# Patient Record
Sex: Female | Born: 1965 | Race: White | Hispanic: No | Marital: Married | Smoking: Never smoker
Health system: Southern US, Community
[De-identification: ages and names within clinical notes are randomized; demographics above are authoritative.]

## PROBLEM LIST (undated history)

## (undated) DIAGNOSIS — I82409 Acute embolism and thrombosis of unspecified deep veins of unspecified lower extremity: Secondary | ICD-10-CM

## (undated) DIAGNOSIS — D649 Anemia, unspecified: Secondary | ICD-10-CM

## (undated) DIAGNOSIS — F32A Depression, unspecified: Secondary | ICD-10-CM

## (undated) DIAGNOSIS — F419 Anxiety disorder, unspecified: Secondary | ICD-10-CM

## (undated) DIAGNOSIS — F329 Major depressive disorder, single episode, unspecified: Secondary | ICD-10-CM

## (undated) HISTORY — PX: ABDOMINAL HYSTERECTOMY: SHX81

---

## 2015-08-24 DIAGNOSIS — F419 Anxiety disorder, unspecified: Secondary | ICD-10-CM | POA: Diagnosis not present

## 2015-08-24 DIAGNOSIS — M545 Low back pain: Secondary | ICD-10-CM | POA: Diagnosis not present

## 2015-08-24 DIAGNOSIS — G4700 Insomnia, unspecified: Secondary | ICD-10-CM | POA: Diagnosis not present

## 2015-08-24 DIAGNOSIS — M25562 Pain in left knee: Secondary | ICD-10-CM | POA: Diagnosis not present

## 2015-10-22 DIAGNOSIS — S8982XD Other specified injuries of left lower leg, subsequent encounter: Secondary | ICD-10-CM | POA: Diagnosis not present

## 2015-11-01 DIAGNOSIS — M2242 Chondromalacia patellae, left knee: Secondary | ICD-10-CM | POA: Diagnosis not present

## 2015-11-01 DIAGNOSIS — S83242A Other tear of medial meniscus, current injury, left knee, initial encounter: Secondary | ICD-10-CM | POA: Diagnosis not present

## 2015-11-30 NOTE — H&P (Signed)
  Beverly Ingram is an 50 y.o. female.    Chief Complaint: left knee pain  HPI: Pt is a 50 y.o. female complaining of left knee pain for several months. Pain had continually increased since the beginning. X-rays in the clinic show meniscal tear left knee. Pt has tried various conservative treatments which have failed to alleviate their symptoms, including injections and therapy. Various options are discussed with the patient. Risks, benefits and expectations were discussed with the patient. Patient understand the risks, benefits and expectations and wishes to proceed with surgery.   PCP:  No primary care provider on file.  D/C Plans: Home  PMH: No past medical history on file.  PSH: No past surgical history on file.  Social History:  has no tobacco, alcohol, and drug history on file.  Allergies:  Allergies not on file  Medications: No current facility-administered medications for this encounter.    No current outpatient prescriptions on file.    No results found for this or any previous visit (from the past 48 hour(s)). No results found.  ROS: Pain with rom of the left lower extremity  Physical Exam:  Alert and oriented 50 y.o. female in no acute distress Cranial nerves 2-12 intact Cervical spine: full rom with no tenderness, nv intact distally Chest: active breath sounds bilaterally, no wheeze rhonchi or rales Heart: regular rate and rhythm, no murmur Abd: non tender non distended with active bowel sounds Hip is stable with rom  Left knee moderate medial joint line tenderness nv intact distally No rashes or edema Minimally antalgic gait  Assessment/Plan Assessment: left knee meniscal tear  Plan: Patient will undergo a left knee arthroscopy by Dr. Ranell PatrickNorris at [facility]. Risks benefits and expectations were discussed with the patient. Patient understand risks, benefits and expectations and wishes to proceed.

## 2015-12-14 NOTE — Pre-Procedure Instructions (Signed)
    Beverly Ingram  12/14/2015      [id number] - [place], [place] - [address] [place] [id number] Phone: [phone] Fax: [phone]    Your procedure is scheduled on 12/21/15.  [facility] Admitting at 12 noon  Call this number if you have problems the morning of surgery:  [phone]   Remember:  Do not eat food or drink liquids after midnight.  Take these medicines the morning of surgery with A SIP OF WATER estrace,zoloft,ultram   Do not wear jewelry, make-up or nail polish.  Do not wear lotions, powders, or perfumes, or deoderant.  Do not shave 48 hours prior to surgery.  Men may shave face and neck.  Do not bring valuables to the hospital.  [facility] is not responsible for any belongings or valuables.  Contacts, dentures or bridgework may not be worn into surgery.  Leave your suitcase in the car.  After surgery it may be brought to your room.  For patients admitted to the hospital, discharge time will be determined by your treatment team.  Patients discharged the day of surgery will not be allowed to drive home.   Name and phone number of your driver:   Special instructions: Do not take any aspirin,anti-inflammatories,vitamins,or herbal supplements 5-7 days prior to surgery.  Please read over the following fact sheets that you were given. MRSA Information

## 2015-12-15 ENCOUNTER — Encounter (HOSPITAL_COMMUNITY): Payer: Self-pay

## 2015-12-15 ENCOUNTER — Encounter (HOSPITAL_COMMUNITY)
Admission: RE | Admit: 2015-12-15 | Discharge: 2015-12-15 | Disposition: A | Discharge status: Home / Self Care | Payer: BLUE CROSS/BLUE SHIELD | Source: Ambulatory Visit | Attending: Orthopedic Surgery | Admitting: Orthopedic Surgery

## 2015-12-15 DIAGNOSIS — X58XXXA Exposure to other specified factors, initial encounter: Secondary | ICD-10-CM | POA: Diagnosis not present

## 2015-12-15 DIAGNOSIS — S83207A Unspecified tear of unspecified meniscus, current injury, left knee, initial encounter: Secondary | ICD-10-CM | POA: Diagnosis not present

## 2015-12-15 DIAGNOSIS — Z01812 Encounter for preprocedural laboratory examination: Secondary | ICD-10-CM | POA: Insufficient documentation

## 2015-12-15 HISTORY — DX: Anxiety disorder, unspecified: F41.9

## 2015-12-15 HISTORY — DX: Depression, unspecified: F32.A

## 2015-12-15 HISTORY — DX: Major depressive disorder, single episode, unspecified: F32.9

## 2015-12-15 HISTORY — DX: Anemia, unspecified: D64.9

## 2015-12-15 LAB — BASIC METABOLIC PANEL
ANION GAP: 9 (ref 5–15)
BUN: 6 mg/dL (ref 6–20)
CHLORIDE: 105 mmol/L (ref 101–111)
CO2: 25 mmol/L (ref 22–32)
Calcium: 9.4 mg/dL (ref 8.9–10.3)
Creatinine, Ser: 0.78 mg/dL (ref 0.44–1.00)
GFR calc Af Amer: >60 mL/min (ref >60)
GFR calc non Af Amer: >60 mL/min (ref >60)
GLUCOSE: 80 mg/dL (ref 65–99)
POTASSIUM: 4.1 mmol/L (ref 3.5–5.1)
Sodium: 139 mmol/L (ref 135–145)

## 2015-12-15 LAB — CBC
HEMATOCRIT: 41.6 % (ref 36.0–46.0)
HEMOGLOBIN: 13.4 g/dL (ref 12.0–15.0)
MCH: 30.9 pg (ref 26.0–34.0)
MCHC: 32.2 g/dL (ref 30.0–36.0)
MCV: 96.1 fL (ref 78.0–100.0)
Platelets: 289 10*3/uL (ref 150–400)
RBC: 4.33 MIL/uL (ref 3.87–5.11)
RDW: 12.9 % (ref 11.5–15.5)
WBC: 10.3 10*3/uL (ref 4.0–10.5)

## 2015-12-21 ENCOUNTER — Ambulatory Visit (HOSPITAL_COMMUNITY): Payer: BLUE CROSS/BLUE SHIELD | Admitting: Anesthesiology

## 2015-12-21 ENCOUNTER — Ambulatory Visit (HOSPITAL_COMMUNITY)
Admission: RE | Admit: 2015-12-21 | Discharge: 2015-12-21 | Disposition: A | Discharge status: Home / Self Care | Payer: BLUE CROSS/BLUE SHIELD | Source: Ambulatory Visit | Attending: Orthopedic Surgery | Admitting: Orthopedic Surgery

## 2015-12-21 ENCOUNTER — Encounter (HOSPITAL_COMMUNITY)
Admission: RE | Disposition: A | Discharge status: Home / Self Care | Payer: Self-pay | Source: Ambulatory Visit | Attending: Orthopedic Surgery

## 2015-12-21 ENCOUNTER — Encounter (HOSPITAL_COMMUNITY): Payer: Self-pay | Admitting: *Deleted

## 2015-12-21 DIAGNOSIS — F419 Anxiety disorder, unspecified: Secondary | ICD-10-CM | POA: Diagnosis not present

## 2015-12-21 DIAGNOSIS — M2342 Loose body in knee, left knee: Secondary | ICD-10-CM | POA: Insufficient documentation

## 2015-12-21 DIAGNOSIS — S83242A Other tear of medial meniscus, current injury, left knee, initial encounter: Secondary | ICD-10-CM | POA: Diagnosis not present

## 2015-12-21 DIAGNOSIS — M1990 Unspecified osteoarthritis, unspecified site: Secondary | ICD-10-CM | POA: Diagnosis not present

## 2015-12-21 DIAGNOSIS — M23322 Other meniscus derangements, posterior horn of medial meniscus, left knee: Secondary | ICD-10-CM | POA: Diagnosis not present

## 2015-12-21 DIAGNOSIS — F329 Major depressive disorder, single episode, unspecified: Secondary | ICD-10-CM | POA: Diagnosis not present

## 2015-12-21 DIAGNOSIS — M2242 Chondromalacia patellae, left knee: Secondary | ICD-10-CM | POA: Insufficient documentation

## 2015-12-21 HISTORY — PX: KNEE ARTHROSCOPY: SHX127

## 2015-12-21 SURGERY — ARTHROSCOPY, KNEE
Anesthesia: General | Site: Knee | Laterality: Left

## 2015-12-21 MED ORDER — KETOROLAC TROMETHAMINE 30 MG/ML IJ SOLN
INTRAMUSCULAR | Status: DC | PRN
  Administered 2015-12-21: 30 mg via INTRAVENOUS

## 2015-12-21 MED ORDER — CEFAZOLIN SODIUM-DEXTROSE 2-4 GM/100ML-% IV SOLN: 2.0000 g | INTRAVENOUS | Status: DC

## 2015-12-21 MED ORDER — BUPIVACAINE-EPINEPHRINE (PF) 0.5% -1:200000 IJ SOLN
INTRAMUSCULAR | Status: AC
  Filled 2015-12-21: qty 30

## 2015-12-21 MED ORDER — CEFAZOLIN SODIUM-DEXTROSE 2-4 GM/100ML-% IV SOLN
2.0000 g | INTRAVENOUS | Status: AC
  Administered 2015-12-21: 2 g via INTRAVENOUS
  Filled 2015-12-21: qty 100

## 2015-12-21 MED ORDER — LACTATED RINGERS IV SOLN
INTRAVENOUS | Status: DC
  Administered 2015-12-21: 13:00:00 via INTRAVENOUS

## 2015-12-21 MED ORDER — MIDAZOLAM HCL 5 MG/5ML IJ SOLN
INTRAMUSCULAR | Status: DC | PRN
  Administered 2015-12-21: 2 mg via INTRAVENOUS

## 2015-12-21 MED ORDER — ACETAMINOPHEN 325 MG PO TABS: 325.0000 mg | ORAL_TABLET | ORAL | Status: DC | PRN

## 2015-12-21 MED ORDER — PROPOFOL 10 MG/ML IV BOLUS
INTRAVENOUS | Status: AC
  Filled 2015-12-21: qty 20

## 2015-12-21 MED ORDER — METHOCARBAMOL 500 MG PO TABS: 500.0000 mg | ORAL_TABLET | Freq: Three times a day (TID) | ORAL | 1 refills | Status: DC | PRN

## 2015-12-21 MED ORDER — PROPOFOL 10 MG/ML IV BOLUS
INTRAVENOUS | Status: DC | PRN
  Administered 2015-12-21: 30 mg via INTRAVENOUS
  Administered 2015-12-21: 200 mg via INTRAVENOUS
  Administered 2015-12-21: 20 mg via INTRAVENOUS

## 2015-12-21 MED ORDER — LIDOCAINE 2% (20 MG/ML) 5 ML SYRINGE
INTRAMUSCULAR | Status: AC
  Filled 2015-12-21: qty 5

## 2015-12-21 MED ORDER — BUPIVACAINE-EPINEPHRINE 0.5% -1:200000 IJ SOLN
INTRAMUSCULAR | Status: DC | PRN
  Administered 2015-12-21: 30 mL

## 2015-12-21 MED ORDER — OXYCODONE-ACETAMINOPHEN 5-325 MG PO TABS: 1.0000 | ORAL_TABLET | ORAL | 0 refills | Status: DC | PRN

## 2015-12-21 MED ORDER — ACETAMINOPHEN 160 MG/5ML PO SOLN
325.0000 mg | ORAL | Status: DC | PRN
  Filled 2015-12-21: qty 20.3

## 2015-12-21 MED ORDER — OXYCODONE HCL 5 MG PO TABS
5.0000 mg | ORAL_TABLET | Freq: Once | ORAL | Status: AC | PRN
  Administered 2015-12-21: 5 mg via ORAL

## 2015-12-21 MED ORDER — MIDAZOLAM HCL 2 MG/2ML IJ SOLN
INTRAMUSCULAR | Status: AC
  Filled 2015-12-21: qty 2

## 2015-12-21 MED ORDER — FENTANYL CITRATE (PF) 100 MCG/2ML IJ SOLN
INTRAMUSCULAR | Status: AC
  Filled 2015-12-21: qty 2

## 2015-12-21 MED ORDER — OXYCODONE HCL 5 MG/5ML PO SOLN: 5.0000 mg | Freq: Once | ORAL | Status: AC | PRN

## 2015-12-21 MED ORDER — OXYCODONE HCL 5 MG PO TABS
ORAL_TABLET | ORAL | Status: AC
  Filled 2015-12-21: qty 1

## 2015-12-21 MED ORDER — HYDROMORPHONE HCL 1 MG/ML IJ SOLN
INTRAMUSCULAR | Status: AC
  Filled 2015-12-21: qty 1

## 2015-12-21 MED ORDER — ONDANSETRON HCL 4 MG/2ML IJ SOLN
INTRAMUSCULAR | Status: DC | PRN
  Administered 2015-12-21: 4 mg via INTRAVENOUS

## 2015-12-21 MED ORDER — CHLORHEXIDINE GLUCONATE 4 % EX LIQD: 60.0000 mL | Freq: Once | CUTANEOUS | Status: DC

## 2015-12-21 MED ORDER — LIDOCAINE 2% (20 MG/ML) 5 ML SYRINGE
INTRAMUSCULAR | Status: DC | PRN
  Administered 2015-12-21: 20 mg via INTRAVENOUS

## 2015-12-21 MED ORDER — SODIUM CHLORIDE 0.9 % IR SOLN
Status: DC | PRN
  Administered 2015-12-21: 6000 mL

## 2015-12-21 MED ORDER — HYDROMORPHONE HCL 1 MG/ML IJ SOLN
0.2500 mg | INTRAMUSCULAR | Status: DC | PRN
  Administered 2015-12-21 (×2): 0.5 mg via INTRAVENOUS

## 2015-12-21 MED ORDER — FENTANYL CITRATE (PF) 100 MCG/2ML IJ SOLN
INTRAMUSCULAR | Status: DC | PRN
  Administered 2015-12-21 (×2): 50 ug via INTRAVENOUS

## 2015-12-21 SURGICAL SUPPLY — 31 items
BLADE CUTTER GATOR 3.5 (BLADE) ×2 IMPLANT
BNDG COHESIVE 6X5 TAN STRL LF (GAUZE/BANDAGES/DRESSINGS) ×2 IMPLANT
BNDG ELASTIC 6X10 VLCR STRL LF (GAUZE/BANDAGES/DRESSINGS) ×2 IMPLANT
BNDG GAUZE ELAST 4 BULKY (GAUZE/BANDAGES/DRESSINGS) ×4 IMPLANT
DRAPE ARTHROSCOPY W/POUCH 114 (DRAPES) ×2 IMPLANT
DURAPREP 26ML APPLICATOR (WOUND CARE) ×2 IMPLANT
ELECT MENISCUS 165MM 90D (ELECTRODE) IMPLANT
ELECT REM PT RETURN 9FT ADLT (ELECTROSURGICAL) ×2
ELECTRODE REM PT RTRN 9FT ADLT (ELECTROSURGICAL) ×1 IMPLANT
GAUZE SPONGE 4X4 12PLY STRL (GAUZE/BANDAGES/DRESSINGS) ×2 IMPLANT
GAUZE SPONGE 4X4 16PLY XRAY LF (GAUZE/BANDAGES/DRESSINGS) ×2 IMPLANT
GLOVE BIOGEL PI ORTHO PRO SZ8 (GLOVE) ×1
GLOVE PI ORTHO PRO STRL SZ8 (GLOVE) ×1 IMPLANT
GLOVE SURG ORTHO 8.5 STRL (GLOVE) ×2 IMPLANT
GOWN STRL REUS W/ TWL XL LVL3 (GOWN DISPOSABLE) ×2 IMPLANT
GOWN STRL REUS W/TWL XL LVL3 (GOWN DISPOSABLE) ×2
KIT BASIN OR (CUSTOM PROCEDURE TRAY) ×2 IMPLANT
KIT ROOM TURNOVER OR (KITS) ×2 IMPLANT
MANIFOLD NEPTUNE II (INSTRUMENTS) ×2 IMPLANT
PACK ARTHROSCOPY DSU (CUSTOM PROCEDURE TRAY) ×2 IMPLANT
PAD ARMBOARD 7.5X6 YLW CONV (MISCELLANEOUS) ×2 IMPLANT
PENCIL BUTTON HOLSTER BLD 10FT (ELECTRODE) IMPLANT
SET ARTHROSCOPY TUBING (MISCELLANEOUS) ×1
SET ARTHROSCOPY TUBING LN (MISCELLANEOUS) ×1 IMPLANT
STRIP CLOSURE SKIN 1/2X4 (GAUZE/BANDAGES/DRESSINGS) ×2 IMPLANT
SUT MNCRL AB 4-0 PS2 18 (SUTURE) ×2 IMPLANT
TOWEL OR 17X24 6PK STRL BLUE (TOWEL DISPOSABLE) ×4 IMPLANT
TUBE CONNECTING 12X1/4 (SUCTIONS) ×2 IMPLANT
WAND HAND CNTRL MULTIVAC 90 (MISCELLANEOUS) IMPLANT
WATER STERILE IRR 1000ML POUR (IV SOLUTION) ×2 IMPLANT
WRAP KNEE MAXI GEL POST OP (GAUZE/BANDAGES/DRESSINGS) ×2 IMPLANT

## 2015-12-21 NOTE — Anesthesia Preprocedure Evaluation (Addendum)
Anesthesia Evaluation  Patient identified by MRN, date of birth, ID band Patient awake    Reviewed: Allergy & Precautions, NPO status , Patient's Chart, lab work & pertinent test results  History of Anesthesia Complications Negative for: history of anesthetic complications  Airway Mallampati: II  TM Distance: >3 FB Neck ROM: Full    Dental  (+) Teeth Intact   Pulmonary neg pulmonary ROS,    breath sounds clear to auscultation       Cardiovascular negative cardio ROS   Rhythm:Regular     Neuro/Psych PSYCHIATRIC DISORDERS Anxiety Depression negative neurological ROS     GI/Hepatic negative GI ROS, Neg liver ROS,   Endo/Other  negative endocrine ROS  Renal/GU negative Renal ROS     Musculoskeletal  (+) Arthritis ,   Abdominal   Peds  Hematology negative hematology ROS (+)   Anesthesia Other Findings   Reproductive/Obstetrics                            Anesthesia Physical Anesthesia Plan  ASA: I  Anesthesia Plan: General   Post-op Pain Management:    Induction: Intravenous  Airway Management Planned: LMA  Additional Equipment: None  Intra-op Plan:   Post-operative Plan: Extubation in OR  Informed Consent: I have reviewed the patients History and Physical, chart, labs and discussed the procedure including the risks, benefits and alternatives for the proposed anesthesia with the patient or authorized representative who has indicated his/her understanding and acceptance.   Dental advisory given  Plan Discussed with: CRNA and Surgeon  Anesthesia Plan Comments:        Anesthesia Quick Evaluation

## 2015-12-21 NOTE — Transfer of Care (Signed)
Immediate Anesthesia Transfer of Care Note  Patient: Beverly Ingram  Procedure(s) Performed: Procedure(s): ARTHROSCOPY LEFT KNEE (Left)  Patient Location: PACU  Anesthesia Type:General  Level of Consciousness: awake, alert , oriented and patient cooperative  Airway & Oxygen Therapy: Patient Spontanous Breathing and Patient connected to nasal cannula oxygen  Post-op Assessment: Report given to RN, Post -op Vital signs reviewed and stable and Patient moving all extremities  Post vital signs: Reviewed and stable  Last Vitals:  Vitals:   12/21/15 1210  BP: (!) 127/59  Pulse: (!) 55  Resp: 18  Temp: 36.9 C    Last Pain:  Vitals:   12/21/15 1259  TempSrc:   PainSc: 4          Complications: No apparent anesthesia complications

## 2015-12-21 NOTE — Anesthesia Procedure Notes (Signed)
Procedure Name: LMA Insertion Date/Time: 12/21/2015 2:20 PM Performed by: Sharlene DoryWALKER, Murrell Dome E Pre-anesthesia Checklist: Patient identified, Emergency Drugs available, Suction available, Patient being monitored and Timeout performed Patient Re-evaluated:Patient Re-evaluated prior to inductionOxygen Delivery Method: Circle system utilized Preoxygenation: Pre-oxygenation with 100% oxygen Intubation Type: IV induction Ventilation: Mask ventilation without difficulty LMA: LMA inserted LMA Size: 4.0 Number of attempts: 1 Placement Confirmation: positive ETCO2 and breath sounds checked- equal and bilateral Tube secured with: Tape Dental Injury: Teeth and Oropharynx as per pre-operative assessment

## 2015-12-21 NOTE — Discharge Instructions (Signed)
Ice the knee as much as you can.  Elevate the leg under the ankle, Do NOT prop under the knee.  Keep surgical bandage in place until Friday or Saturday, then remove Wrap and gauze, then put Band Aids over the Steristrips and pull on the stocking on the left leg.  Keep stockings on both legs full time to prevent blood clots  Do exercises every hour while awake: Ankle Pumps Heel slides (knee bending) Quad sets - thigh tightening  Minimal weight on the leg week one, then progress with crutches.  Follow up with Dr Ranell PatrickNorris in two weeks  [phone]

## 2015-12-21 NOTE — Brief Op Note (Signed)
12/21/2015  3:23 PM  PATIENT:  Beverly Ingram  50 y.o. 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  POST-OPERATIVE DIAGNOSIS:  left knee medial meniscal tear and chondromalcia patella, MFC CM grade III, loose bodies  PROCEDURE:  Procedure(s): ARTHROSCOPY LEFT KNEE (Left) partial medial meniscectomy, chondroplasty MFC, PF joint, removal of loose bodies  SURGEON:  Surgeon(s) and Role:    * Beverely LowSteve Acheron Sugg, MD - Primary  PHYSICIAN ASSISTANT:   ASSISTANTS: none   ANESTHESIA:   general  EBL:  Total I/O In: 800 [I.V.:800] Out: 25 [Blood:25]  BLOOD ADMINISTERED:none  DRAINS: none   LOCAL MEDICATIONS USED:  MARCAINE     SPECIMEN:  No Specimen  DISPOSITION OF SPECIMEN:  N/A  COUNTS:  YES  TOURNIQUET:  * No tourniquets in log *  DICTATION: .Other Dictation: Dictation Number [id number]  PLAN OF CARE: Discharge to home after PACU  PATIENT DISPOSITION:  PACU - hemodynamically stable.   Delay start of Pharmacological VTE agent (>24hrs) due to surgical blood loss or risk of bleeding: not applicable

## 2015-12-21 NOTE — Interval H&P Note (Signed)
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 After consideration of risks, benefits and other options for treatment, the patient has consented to  Procedure(s): ARTHROSCOPY LEFT KNEE (Left) as a surgical intervention .  The patient's history has been reviewed, patient examined, no change in status, stable for surgery.  I have reviewed the patient's chart and labs.  Questions were answered to the patient's satisfaction.     Jury Caserta,STEVEN R

## 2015-12-21 NOTE — Op Note (Signed)
NAMAla Bent:  Beverly Ingram, Beverly Ingram                 ACCOUNT NO.:  [id number]  MEDICAL RECORD NO.:  [id number]  LOCATION:  [facility]                         FACILITY:  [facility]  PHYSICIAN:  Almedia BallsSteven R. Ranell PatrickNorris, M.D. DATE OF BIRTH:  27-Jan-1966  DATE OF PROCEDURE:  12/21/2015 DATE OF DISCHARGE:                              OPERATIVE REPORT   PREOPERATIVE DIAGNOSES: 1. Left knee pain secondary to torn medial meniscus. 2. 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  POSTOPERATIVE DIAGNOSES: 1. Left knee pain secondary to torn medial meniscus. 2. 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  PROCEDURE PERFORMED:  Left knee arthroscopy, partial medial meniscectomy, chondroplasty not to bleeding bone in medial compartment, patellofemoral compartment and removal of multiple loose bodies arthroscopically.  ATTENDING SURGEON:  Almedia BallsSteven R. Ranell PatrickNorris, M.D.  ASSISTANT:  None.  ANESTHESIA:  LMA general anesthesia was used.  ESTIMATED BLOOD LOSS:  Minimal.  FLUID REPLACEMENT:  1200 mL of crystalloid.  INSTRUMENT COUNTS:  Correct.  COMPLICATIONS:  There were no complications.  ANTIBIOTICS:  Perioperative antibiotics were given.  INDICATIONS:  The patient is a 50 year old female with worsening left medial knee pain secondary to torn medial meniscus.  The patient has failed conservative management and desires operative treatment to restore function and eliminate pain.  Operative consent was obtained.  DESCRIPTION OF PROCEDURE:  After an adequate level of anesthesia was achieved, the patient was positioned in the supine position.  Left leg correctly identified and sterilely prepped and draped in usual manner. Time-out called.  We utilized a lateral post for positioning.   After sterile prep and drape and time-out, we went ahead and entered the knee using standard arthroscopic portals including superolateral outflow, anterolateral scope, and anteromedial working portals.  Extensive synovitis noted throughout the knee joint.  Patellofemoral articular cartilage with grade 3 chondromalacia in medial facet and there were couple areas of near grade 4.  Loose flaps and fibrillation encountered that was removed using tangential chondroplasty technique.  Multiple loose bodies identified, fairly large.  We evacuated using suction shaver and graspers.  We then entered the medial compartment.  There was a complex posterior horn medial meniscus tear that was basically a concealed tear only visible on the tibial surface.  I was able to stick a probe into a pole and there were some unstable tissue there.  We used basket forceps and motorized shaver and removed about 30% of posterior horn, medial meniscus maybe 40%, the remaining meniscal tissue was probed and felt to be stable.  There was also grade 3 chondromalacia located over a probably 2 x 3 cm area on the femoral condyle and then a focal grade 4 lesion on the tibia basically scuff-type lesion there.  We used again tangential chondroplasty technique, motorized shaver to remove loose flaps and fibrillation.  Following completion of partial meniscectomy and chondroplasty on both sides in that medial compartment, we entered the notch, ACL and PCL intact.  Lateral compartment pristine. No meniscal tears were identified.  No articular cartilage damage. Final lavage of the knee joint and evacuation of a few more small loose bodies.  I then sutured the wounds with 4-0 Monocryl followed by Steri- Strips and sterile compressive bandage.  The patient tolerated the surgery well.     Almedia Balls. Ranell Patrick, M.D.     SRN/MEDQ  D:  12/21/2015  T:  12/21/2015  Job:  [id number]

## 2015-12-22 ENCOUNTER — Encounter (HOSPITAL_COMMUNITY): Payer: Self-pay | Admitting: Orthopedic Surgery

## 2015-12-22 NOTE — Anesthesia Postprocedure Evaluation (Signed)
Anesthesia Post Note  Patient: Beverly Ingram  Procedure(s) Performed: Procedure(s) (LRB): ARTHROSCOPY LEFT KNEE (Left)  Patient location during evaluation: PACU Anesthesia Type: General Level of consciousness: awake and alert, oriented and patient cooperative Pain management: pain level controlled Vital Signs Assessment: post-procedure vital signs reviewed and stable Respiratory status: spontaneous breathing, nonlabored ventilation and respiratory function stable Cardiovascular status: blood pressure returned to baseline and stable Postop Assessment: no signs of nausea or vomiting Anesthetic complications: no Comments: Delayed entry, pt eval in PACU 8/30     Last Vitals:  Vitals:   12/21/15 1600 12/21/15 1615  BP: 136/81 108/70  Pulse: 69 71  Resp: 12 10  Temp:  36.6 C    Last Pain:  Vitals:   12/21/15 1615  TempSrc:   PainSc: 0-No pain   Pain Goal: Patients Stated Pain Goal: 3 (12/21/15 1515)               Beverly Ingram,E. Clearnce Leja

## 2016-02-01 DIAGNOSIS — S83242D Other tear of medial meniscus, current injury, left knee, subsequent encounter: Secondary | ICD-10-CM | POA: Diagnosis not present

## 2016-03-06 DIAGNOSIS — E2839 Other primary ovarian failure: Secondary | ICD-10-CM | POA: Diagnosis not present

## 2016-03-06 DIAGNOSIS — Z79890 Hormone replacement therapy (postmenopausal): Secondary | ICD-10-CM | POA: Diagnosis not present

## 2016-03-06 DIAGNOSIS — M25562 Pain in left knee: Secondary | ICD-10-CM | POA: Diagnosis not present

## 2016-03-06 DIAGNOSIS — F419 Anxiety disorder, unspecified: Secondary | ICD-10-CM | POA: Diagnosis not present

## 2016-04-10 DIAGNOSIS — S83242D Other tear of medial meniscus, current injury, left knee, subsequent encounter: Secondary | ICD-10-CM | POA: Diagnosis not present

## 2016-04-10 DIAGNOSIS — Z4789 Encounter for other orthopedic aftercare: Secondary | ICD-10-CM | POA: Diagnosis not present

## 2016-04-10 DIAGNOSIS — M1712 Unilateral primary osteoarthritis, left knee: Secondary | ICD-10-CM | POA: Diagnosis not present

## 2016-05-04 DIAGNOSIS — M2242 Chondromalacia patellae, left knee: Secondary | ICD-10-CM | POA: Diagnosis not present

## 2016-05-04 DIAGNOSIS — M25562 Pain in left knee: Secondary | ICD-10-CM | POA: Diagnosis not present

## 2016-05-04 DIAGNOSIS — M1712 Unilateral primary osteoarthritis, left knee: Secondary | ICD-10-CM | POA: Diagnosis not present

## 2016-05-25 DIAGNOSIS — M25562 Pain in left knee: Secondary | ICD-10-CM | POA: Diagnosis not present

## 2016-05-29 DIAGNOSIS — Z01818 Encounter for other preprocedural examination: Secondary | ICD-10-CM | POA: Diagnosis not present

## 2016-05-29 DIAGNOSIS — M1712 Unilateral primary osteoarthritis, left knee: Secondary | ICD-10-CM | POA: Diagnosis not present

## 2016-06-07 DIAGNOSIS — M1712 Unilateral primary osteoarthritis, left knee: Secondary | ICD-10-CM | POA: Diagnosis not present

## 2016-06-14 DIAGNOSIS — M1712 Unilateral primary osteoarthritis, left knee: Secondary | ICD-10-CM | POA: Diagnosis not present

## 2016-06-14 DIAGNOSIS — M179 Osteoarthritis of knee, unspecified: Secondary | ICD-10-CM | POA: Diagnosis not present

## 2016-06-14 DIAGNOSIS — Z96651 Presence of right artificial knee joint: Secondary | ICD-10-CM | POA: Diagnosis not present

## 2016-06-14 HISTORY — PX: KNEE SURGERY: SHX244

## 2016-06-18 DIAGNOSIS — M1712 Unilateral primary osteoarthritis, left knee: Secondary | ICD-10-CM | POA: Diagnosis not present

## 2016-06-20 DIAGNOSIS — M1712 Unilateral primary osteoarthritis, left knee: Secondary | ICD-10-CM | POA: Diagnosis not present

## 2016-06-26 DIAGNOSIS — M1712 Unilateral primary osteoarthritis, left knee: Secondary | ICD-10-CM | POA: Diagnosis not present

## 2016-06-29 DIAGNOSIS — M1712 Unilateral primary osteoarthritis, left knee: Secondary | ICD-10-CM | POA: Diagnosis not present

## 2016-07-03 DIAGNOSIS — M1712 Unilateral primary osteoarthritis, left knee: Secondary | ICD-10-CM | POA: Diagnosis not present

## 2016-07-05 DIAGNOSIS — M1712 Unilateral primary osteoarthritis, left knee: Secondary | ICD-10-CM | POA: Diagnosis not present

## 2016-07-10 DIAGNOSIS — M1712 Unilateral primary osteoarthritis, left knee: Secondary | ICD-10-CM | POA: Diagnosis not present

## 2016-07-12 DIAGNOSIS — M1712 Unilateral primary osteoarthritis, left knee: Secondary | ICD-10-CM | POA: Diagnosis not present

## 2016-07-16 DIAGNOSIS — M1712 Unilateral primary osteoarthritis, left knee: Secondary | ICD-10-CM | POA: Diagnosis not present

## 2016-07-18 DIAGNOSIS — M1712 Unilateral primary osteoarthritis, left knee: Secondary | ICD-10-CM | POA: Diagnosis not present

## 2016-07-24 DIAGNOSIS — M1712 Unilateral primary osteoarthritis, left knee: Secondary | ICD-10-CM | POA: Diagnosis not present

## 2016-07-31 DIAGNOSIS — M1712 Unilateral primary osteoarthritis, left knee: Secondary | ICD-10-CM | POA: Diagnosis not present

## 2016-09-04 DIAGNOSIS — M1712 Unilateral primary osteoarthritis, left knee: Secondary | ICD-10-CM | POA: Diagnosis not present

## 2016-09-04 DIAGNOSIS — F419 Anxiety disorder, unspecified: Secondary | ICD-10-CM | POA: Diagnosis not present

## 2016-09-12 ENCOUNTER — Ambulatory Visit (HOSPITAL_COMMUNITY)
Admission: RE | Admit: 2016-09-12 | Discharge: 2016-09-12 | Disposition: A | Discharge status: Home / Self Care | Payer: BLUE CROSS/BLUE SHIELD | Source: Ambulatory Visit | Attending: Cardiology | Admitting: Cardiology

## 2016-09-12 ENCOUNTER — Other Ambulatory Visit: Payer: Self-pay | Admitting: Orthopedic Surgery

## 2016-09-12 DIAGNOSIS — M79605 Pain in left leg: Secondary | ICD-10-CM | POA: Insufficient documentation

## 2016-09-12 DIAGNOSIS — M7989 Other specified soft tissue disorders: Principal | ICD-10-CM

## 2016-11-26 ENCOUNTER — Other Ambulatory Visit: Payer: Self-pay | Admitting: Orthopedic Surgery

## 2016-11-26 DIAGNOSIS — Z86718 Personal history of other venous thrombosis and embolism: Secondary | ICD-10-CM

## 2016-12-13 ENCOUNTER — Ambulatory Visit (HOSPITAL_COMMUNITY)
Admission: RE | Admit: 2016-12-13 | Discharge: 2016-12-13 | Disposition: A | Discharge status: Home / Self Care | Payer: BLUE CROSS/BLUE SHIELD | Source: Ambulatory Visit | Attending: Cardiology | Admitting: Cardiology

## 2016-12-13 DIAGNOSIS — M25662 Stiffness of left knee, not elsewhere classified: Secondary | ICD-10-CM | POA: Diagnosis not present

## 2016-12-13 DIAGNOSIS — I80292 Phlebitis and thrombophlebitis of other deep vessels of left lower extremity: Secondary | ICD-10-CM | POA: Diagnosis not present

## 2016-12-13 DIAGNOSIS — M79662 Pain in left lower leg: Secondary | ICD-10-CM | POA: Diagnosis not present

## 2016-12-13 DIAGNOSIS — Z86718 Personal history of other venous thrombosis and embolism: Secondary | ICD-10-CM | POA: Diagnosis not present

## 2016-12-13 DIAGNOSIS — I80222 Phlebitis and thrombophlebitis of left popliteal vein: Secondary | ICD-10-CM | POA: Diagnosis not present

## 2016-12-13 DIAGNOSIS — Z96652 Presence of left artificial knee joint: Secondary | ICD-10-CM | POA: Diagnosis not present

## 2016-12-13 DIAGNOSIS — Z471 Aftercare following joint replacement surgery: Secondary | ICD-10-CM | POA: Diagnosis not present

## 2016-12-14 DIAGNOSIS — I82409 Acute embolism and thrombosis of unspecified deep veins of unspecified lower extremity: Secondary | ICD-10-CM | POA: Diagnosis not present

## 2016-12-17 ENCOUNTER — Encounter: Payer: Self-pay | Admitting: Hematology & Oncology

## 2016-12-18 ENCOUNTER — Ambulatory Visit (HOSPITAL_BASED_OUTPATIENT_CLINIC_OR_DEPARTMENT_OTHER): Payer: BLUE CROSS/BLUE SHIELD | Admitting: Hematology & Oncology

## 2016-12-18 ENCOUNTER — Other Ambulatory Visit (HOSPITAL_BASED_OUTPATIENT_CLINIC_OR_DEPARTMENT_OTHER): Payer: BLUE CROSS/BLUE SHIELD

## 2016-12-18 VITALS — BP 114/76 | HR 68 | Temp 97.9°F | Resp 16 | Wt 210.0 lb

## 2016-12-18 DIAGNOSIS — I82432 Acute embolism and thrombosis of left popliteal vein: Secondary | ICD-10-CM

## 2016-12-18 LAB — COMPREHENSIVE METABOLIC PANEL
ALBUMIN: 4.3 g/dL (ref 3.5–5.0)
ALK PHOS: 78 U/L (ref 40–150)
ALT: 43 U/L (ref 0–55)
AST: 28 U/L (ref 5–34)
Anion Gap: 8 mEq/L (ref 3–11)
BUN: 15.7 mg/dL (ref 7.0–26.0)
CALCIUM: 9.8 mg/dL (ref 8.4–10.4)
CO2: 27 mEq/L (ref 22–29)
Chloride: 106 mEq/L (ref 98–109)
Creatinine: 0.9 mg/dL (ref 0.6–1.1)
EGFR: 80 mL/min/{1.73_m2} — AB (ref >90)
GLUCOSE: 106 mg/dL (ref 70–140)
POTASSIUM: 4.2 meq/L (ref 3.5–5.1)
SODIUM: 140 meq/L (ref 136–145)
Total Bilirubin: 0.34 mg/dL (ref 0.20–1.20)
Total Protein: 7.7 g/dL (ref 6.4–8.3)

## 2016-12-18 LAB — CBC WITH DIFFERENTIAL (CANCER CENTER ONLY)
BASO#: 0 10*3/uL (ref 0.0–0.2)
BASO%: 0.5 % (ref 0.0–2.0)
EOS ABS: 0.2 10*3/uL (ref 0.0–0.5)
EOS%: 2 % (ref 0.0–7.0)
HCT: 39.4 % (ref 34.8–46.6)
HEMOGLOBIN: 12.9 g/dL (ref 11.6–15.9)
LYMPH#: 1.9 10*3/uL (ref 0.9–3.3)
LYMPH%: 21.1 % (ref 14.0–48.0)
MCH: 31.6 pg (ref 26.0–34.0)
MCHC: 32.7 g/dL (ref 32.0–36.0)
MCV: 97 fL (ref 81–101)
MONO#: 0.6 10*3/uL (ref 0.1–0.9)
MONO%: 6.8 % (ref 0.0–13.0)
NEUT%: 69.6 % (ref 39.6–80.0)
NEUTROS ABS: 6.2 10*3/uL (ref 1.5–6.5)
Platelets: 236 10*3/uL (ref 145–400)
RBC: 4.08 10*6/uL (ref 3.70–5.32)
RDW: 13.2 % (ref 11.1–15.7)
WBC: 8.9 10*3/uL (ref 3.9–10.0)

## 2016-12-18 MED ORDER — DABIGATRAN ETEXILATE MESYLATE 150 MG PO CAPS: 150.0000 mg | ORAL_CAPSULE | Freq: Two times a day (BID) | ORAL | 12 refills | Status: DC

## 2016-12-18 NOTE — Progress Notes (Signed)
Referral MD  Reason for Referral: Left lower extremity DVT-while taking Xarelto   Chief Complaint  Patient presents with  . New Patient (Initial Visit)  : I had a blood clot in my left leg while taking Xarelto.  HPI: Mrs. Kroenke is a very charming 51 year old white female. She is originally from [place]. Her husband works for [employer]. They have moved quite a bit. They have been in [place] for 3 years.  She had left knee surgery back in February. She did well with this. She developed some swelling in the left leg about 3 months after words. She had been on low-dose aspirin.  A Doppler was done. This showed a thrombus in the left femoral vein.  She was placed on Xarelto. She hadn't taken the Xarelto faithfully.  She had been on Premarin. Her family doctor, being very astute, stopped this prior to her knee surgery.  She then began to have pain and some slight swelling behind the left knee. There was no recent travel. She does not smoke. She had no trauma. She was trying to exercise. She having gained some weight because she is not been able to exercise.  She then had a another Doppler done. This was done on August 23. This showed a thrombus in the left popliteal vein. 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  She was switched over to [facility].  There is no family history of thromboembolic disease. She has had 2 pregnancies without problems. She's had no miscarriages.  She's had asked hysterectomy. She had no problems postop.  She was kindly referred to the [facility] for an evaluation.  She is no ELIQUIS for a few days. There is still some pain and swelling in the left leg. She does have a compression stocking. I am not sure how much she really wears this.  She has had her mammograms. She, I don't believe is had a colonoscopy yet.  There's been no change in bowel or bladder habits. She's had no cough. Is no chest wall pain. She's had no  arm swelling. She's had no rashes.  Overall, her performance status is ECOG 1.    Past Medical History:  Diagnosis Date  . Anemia   . Anxiety   . Depression   :  Past Surgical History:  Procedure Laterality Date  . ABDOMINAL HYSTERECTOMY    . KNEE ARTHROSCOPY Left 12/21/2015   Procedure: ARTHROSCOPY LEFT KNEE;  Surgeon: Beverely Low, MD;  Location: [facility] OR;  Service: Orthopedics;  Laterality: Left;  :   Current Outpatient Prescriptions:  .  cetirizine (ZYRTEC) 10 MG chewable tablet, Chew 10 mg by mouth daily., Disp: , Rfl:  .  Lysine 1000 MG TABS, Take by mouth., Disp: , Rfl:  .  vitamin B-12 (CYANOCOBALAMIN) 1000 MCG tablet, Take 1,000 mcg by mouth daily., Disp: , Rfl:  .  dabigatran (PRADAXA) 150 MG CAPS capsule, Take 1 capsule (150 mg total) by mouth 2 (two) times daily., Disp: 60 capsule, Rfl: 12 .  meloxicam (MOBIC) 15 MG tablet, TAKE 1/2 TO 1 TABLET ONCE A DAY ORALLY, Disp: , Rfl: 1 .  Probiotic Product (PROBIOTIC PO), Take 1 tablet by mouth daily., Disp: , Rfl:  .  sertraline (ZOLOFT) 50 MG tablet, Take 50 mg by mouth daily., Disp: , Rfl: 5:  :  Allergies  Allergen Reactions  . No Known Allergies   :  No family history on file.:  Social History   Social History  .  Marital status: Married    Spouse name: N/A  . Number of children: N/A  . Years of education: N/A   Occupational History  . Not on file.   Social History Main Topics  . Smoking status: Never Smoker  . Smokeless tobacco: Never Used  . Alcohol use Yes     Comment: occ  . Drug use: No  . Sexual activity: Not on file   Other Topics Concern  . Not on file   Social History Narrative  . No narrative on file  :  Pertinent items are noted in HPI.  Exam: Well-developed and well-nourished white female in no obvious distress. Vital signs show temperature of 97.9. Pulse 60. Blood pressure 114/76. Weight is 210 pounds. Head and neck exam shows no ocular or oral lesions. There are no palpable cervical  or supraclavicular lymph nodes. Lungs are clear bilaterally. Cardiac exam regular rate and rhythm with no murmurs, rubs or bruits. Abdomen is soft. She has good bowel sounds. There is no fluid wave. There is no palpable liver or spleen tip. Back exam shows no tenderness over the spine ribs or hips. Extremities shows some slight nonpitting edema of the left leg. She has a positive Homans sign on the left side. No venous cord is noted in the left leg. She has the knee replacement surgical scar that is well-healed. Neurological exam shows no focal neurological deficits. Skin exam shows no rashes, ecchymoses or petechia.    Recent Labs  12/18/16 0907  WBC 8.9  HGB 12.9  HCT 39.4  PLT 236    Recent Labs  12/18/16 0907  NA 140  K 4.2  CO2 27  GLUCOSE 106  BUN 15.7  CREATININE 0.9  CALCIUM 9.8    Blood smear review: None  Pathology: None     Assessment and Plan: Mrs. Pennock is a very charming 51 year old white female. She had a thrombo-embolic event after her left knee surgery. This was in the left femoral vein. She was on Xarelto.   She then appeared to develop a new thrombus in the left popliteal vein.  I just wonder if this was already present and just not located initially. It would be a little unusual just to have a thrombus in the femoral vein and not further down.  Regardless, I think if she has to go on to a different blood thinner. I probably would use Pradaxa. Pradaxa is a direct thrombin inhibitor. As such, this might be a little bit more effective then ELIQUIS which is a factor Xa inhibitor.  She has good renal function so I'll see a problem with using Pradaxa.  I think that a hypercoagulable panel would be necessary. She had been on estrogen. As such, if there is a underlying hypercoagulable state, she would be a high-risk for thromboembolic disease after surgery.  I do think that she might be at risk for post phlebitic syndrome. As such, I gave her a prescription for  a measured thigh-high compression stocking for the left leg. I told her to wear this while she is up and about and active. She can take it off when she is sleeping.  This is a very interesting situation. It is hard to say if she is a true Xarelto failure.  I will like to see her back in about 3-4 weeks. I just want to make sure that everything is doing okay.  I will repeat her Doppler in about 6 weeks.  I spent about 50 minutes with her  today. I answered all of her questions. She has a very good understanding of the situation.  She is very appreciative that we could see her so quickly.

## 2016-12-19 LAB — PROTEIN S ACTIVITY: PROTEIN S ACTIVITY: 105 % (ref 63–140)

## 2016-12-19 LAB — LUPUS ANTICOAGULANT PANEL
DRVVT MIX: 44.3 s (ref 0.0–47.0)
PTT-LA: 37.6 s (ref 0.0–51.9)
dRVVT: 50.4 s — ABNORMAL HIGH (ref 0.0–47.0)

## 2016-12-19 LAB — ANTITHROMBIN III: Antithrombin Activity: 125 % (ref 75–135)

## 2016-12-19 LAB — PROTEIN S, TOTAL: Protein S, Total: 98 % (ref 60–150)

## 2016-12-19 LAB — PROTEIN C ACTIVITY: Protein C-Functional: 125 % (ref 73–180)

## 2016-12-20 LAB — PROTEIN C, TOTAL: PROTEIN C ANTIGEN: 100 % (ref 60–150)

## 2016-12-20 LAB — CARDIOLIPIN ANTIBODIES, IGG, IGM, IGA
Anticardiolipin Ab,IgG,Qn: <9 GPL U/mL (ref 0–14)
Anticardiolipin Ab,IgM,Qn: <9 MPL U/mL (ref 0–12)

## 2016-12-20 LAB — BETA-2-GLYCOPROTEIN I ABS, IGG/M/A: Beta-2 Glyco 1 IgA: <9 GPI IgA units (ref 0–25)

## 2016-12-21 LAB — FACTOR 5 LEIDEN

## 2016-12-25 LAB — PROTHROMBIN GENE MUTATION

## 2017-01-15 ENCOUNTER — Other Ambulatory Visit (HOSPITAL_BASED_OUTPATIENT_CLINIC_OR_DEPARTMENT_OTHER): Payer: BLUE CROSS/BLUE SHIELD

## 2017-01-15 ENCOUNTER — Ambulatory Visit (HOSPITAL_BASED_OUTPATIENT_CLINIC_OR_DEPARTMENT_OTHER): Payer: BLUE CROSS/BLUE SHIELD | Admitting: Family

## 2017-01-15 VITALS — BP 125/72 | HR 58 | Temp 98.3°F | Resp 16 | Wt 212.0 lb

## 2017-01-15 DIAGNOSIS — I82432 Acute embolism and thrombosis of left popliteal vein: Secondary | ICD-10-CM | POA: Diagnosis not present

## 2017-01-15 DIAGNOSIS — Z1239 Encounter for other screening for malignant neoplasm of breast: Secondary | ICD-10-CM

## 2017-01-15 LAB — CMP (CANCER CENTER ONLY)
ALBUMIN: 3.6 g/dL (ref 3.3–5.5)
ALT(SGPT): 48 U/L — ABNORMAL HIGH (ref 10–47)
AST: 36 U/L (ref 11–38)
Alkaline Phosphatase: 69 U/L (ref 26–84)
BILIRUBIN TOTAL: 0.6 mg/dL (ref 0.20–1.60)
BUN, Bld: 12 mg/dL (ref 7–22)
CALCIUM: 9.4 mg/dL (ref 8.0–10.3)
CHLORIDE: 107 meq/L (ref 98–108)
CO2: 31 meq/L (ref 18–33)
Creat: 1.1 mg/dl (ref 0.6–1.2)
Glucose, Bld: 89 mg/dL (ref 73–118)
Potassium: 4.3 mEq/L (ref 3.3–4.7)
Sodium: 142 mEq/L (ref 128–145)
Total Protein: 6.9 g/dL (ref 6.4–8.1)

## 2017-01-15 LAB — CBC WITH DIFFERENTIAL (CANCER CENTER ONLY)
BASO#: 0 10*3/uL (ref 0.0–0.2)
BASO%: 0.5 % (ref 0.0–2.0)
EOS%: 2.3 % (ref 0.0–7.0)
Eosinophils Absolute: 0.2 10*3/uL (ref 0.0–0.5)
HEMATOCRIT: 38.5 % (ref 34.8–46.6)
HEMOGLOBIN: 12.7 g/dL (ref 11.6–15.9)
LYMPH#: 2 10*3/uL (ref 0.9–3.3)
LYMPH%: 24.9 % (ref 14.0–48.0)
MCH: 32 pg (ref 26.0–34.0)
MCHC: 33 g/dL (ref 32.0–36.0)
MCV: 97 fL (ref 81–101)
MONO#: 0.7 10*3/uL (ref 0.1–0.9)
MONO%: 8.2 % (ref 0.0–13.0)
NEUT%: 64.1 % (ref 39.6–80.0)
NEUTROS ABS: 5.1 10*3/uL (ref 1.5–6.5)
Platelets: 202 10*3/uL (ref 145–400)
RBC: 3.97 10*6/uL (ref 3.70–5.32)
RDW: 13 % (ref 11.1–15.7)
WBC: 7.9 10*3/uL (ref 3.9–10.0)

## 2017-01-15 NOTE — Progress Notes (Signed)
Hematology and Oncology Follow Up Visit  Beverly Ingram [id number] [birth year] 51 y.o. 01/15/2017   Principle Diagnosis:  Left lower extremity DVT  Current Therapy:   Pradaxa 150 mg PO BID   Interim History:  Beverly Ingram is here today for follow-up. She is doing well on Pradaxa and taking her medication 150 mg PO BID as prescribed. Her hyper coag work-up was negative. She had been on estrogen therapy and recently had surgery at the time of diagnosis.  She has had no episodes of bleeding, bruising or petechiae.  She has had a burning sensation behind her left knee that comes and goes. No swelling present in her lower extremities at this time. Pedal pulses are +1.   She has had some fatigue due to not sleeping well at night. She states that when she lays down in her bed at night she will have numbness and tingling in her hands that comes and goes. She states that if this persists she will contact her PCP .  No fever, chills, n/v, cough, rash, dizziness, SOB, chest pain, papitations, abdominal pain or change in bowel or bladder habits.  No swelling or tenderness in her extremities. No c/o pain at this time. She has maintained a good appetite and is staying well hydrated.Marland Kitchen    ECOG Performance Status: 1 - Symptomatic but completely ambulatory  Medications:  Allergies as of 01/15/2017      Reactions   No Known Allergies       Medication List       Accurate as of 01/15/17  8:13 AM. Always use your most recent med list.          cetirizine 10 MG chewable tablet Commonly known as:  ZYRTEC Chew 10 mg by mouth daily.   dabigatran 150 MG Caps capsule Commonly known as:  PRADAXA Take 1 capsule (150 mg total) by mouth 2 (two) times daily.   Lysine 1000 MG Tabs Take by mouth.   meloxicam 15 MG tablet Commonly known as:  MOBIC TAKE 1/2 TO 1 TABLET ONCE A DAY ORALLY   PROBIOTIC PO Take 1 tablet by mouth daily.   sertraline 50 MG tablet Commonly known as:  ZOLOFT Take 50 mg by mouth  daily.   vitamin B-12 1000 MCG tablet Commonly known as:  CYANOCOBALAMIN Take 1,000 mcg by mouth daily.       Allergies:  Allergies  Allergen Reactions  . No Known Allergies     Past Medical History, Surgical history, Social history, and Family History were reviewed and updated.  Review of Systems: All other 10 point review of systems is negative.   Physical Exam:  vitals were not taken for this visit.  Wt Readings from Last 3 Encounters:  12/18/16 210 lb (95.3 kg)  12/21/15 197 lb 12.8 oz (89.7 kg)  12/15/15 197 lb 12.8 oz (89.7 kg)    Ocular: Sclerae unicteric, pupils equal, round and reactive to light Ear-nose-throat: Oropharynx clear, dentition fair Lymphatic: No cervical, supraclavicular or axillary adenopathy Lungs no rales or rhonchi, good excursion bilaterally Heart regular rate and rhythm, no murmur appreciated Abd soft, nontender, positive bowel sounds, no liver or spleen tip palpated on exam, no fluid wave MSK no focal spinal tenderness, no joint edema Neuro: non-focal, well-oriented, appropriate affect Breasts: Deferred   Lab Results  Component Value Date   WBC 7.9 01/15/2017   HGB 12.7 01/15/2017   HCT 38.5 01/15/2017   MCV 97 01/15/2017   PLT 202 01/15/2017   No results  found for: FERRITIN, IRON, TIBC, UIBC, IRONPCTSAT Lab Results  Component Value Date   RBC 3.97 01/15/2017   No results found for: KPAFRELGTCHN, LAMBDASER, KAPLAMBRATIO No results found for: IGGSERUM, IGA, IGMSERUM No results found for: Marda Stalker, SPEI   Chemistry      Component Value Date/Time   NA 140 12/18/2016 0907   K 4.2 12/18/2016 0907   CL 105 12/15/2015 1018   CO2 27 12/18/2016 0907   BUN 15.7 12/18/2016 0907   CREATININE 0.9 12/18/2016 0907      Component Value Date/Time   CALCIUM 9.8 12/18/2016 0907   ALKPHOS 78 12/18/2016 0907   AST 28 12/18/2016 0907   ALT 43 12/18/2016 0907   BILITOT 0.34 12/18/2016  0907      Impression and Plan: Beverly Ingram is a very pleasant 51 yo caucasian female with DVT of the left lower extremity after knee surgery. She had also recently stopped estrogen therapy at the time. She is doing well on Pradaxa and has had no issues with bleeding. The swelling in her left lower extremity has resolved.  We will repeat an [place] in 2 weeks to re-evaluate.  She also states that she has not had a mammogram in over 3 years so I have ordered this as well. She has scheduled both for October 1st.  We will plan to see her back again in another 8 weeks for follow-up.  She promises to contact our office with any questions or concerns. We can certainly see her sooner if need be.   Verdie Mosher, NP 9/25/20188:13 AM

## 2017-01-16 LAB — D-DIMER, QUANTITATIVE (NOT AT ARMC): D-DIMER: 0.54 mg{FEU}/L — AB (ref 0.00–0.49)

## 2017-01-21 ENCOUNTER — Ambulatory Visit (HOSPITAL_BASED_OUTPATIENT_CLINIC_OR_DEPARTMENT_OTHER)
Admission: RE | Admit: 2017-01-21 | Discharge: 2017-01-21 | Disposition: A | Discharge status: Home / Self Care | Payer: BLUE CROSS/BLUE SHIELD | Source: Ambulatory Visit | Attending: Family | Admitting: Family

## 2017-01-21 ENCOUNTER — Encounter (HOSPITAL_BASED_OUTPATIENT_CLINIC_OR_DEPARTMENT_OTHER): Payer: Self-pay

## 2017-01-21 DIAGNOSIS — Z1239 Encounter for other screening for malignant neoplasm of breast: Secondary | ICD-10-CM

## 2017-01-21 DIAGNOSIS — I82409 Acute embolism and thrombosis of unspecified deep veins of unspecified lower extremity: Secondary | ICD-10-CM | POA: Diagnosis not present

## 2017-01-21 DIAGNOSIS — I82432 Acute embolism and thrombosis of left popliteal vein: Secondary | ICD-10-CM | POA: Insufficient documentation

## 2017-01-21 DIAGNOSIS — Z1231 Encounter for screening mammogram for malignant neoplasm of breast: Secondary | ICD-10-CM | POA: Insufficient documentation

## 2017-01-21 HISTORY — DX: Acute embolism and thrombosis of unspecified deep veins of unspecified lower extremity: I82.409

## 2017-01-22 ENCOUNTER — Telehealth: Payer: Self-pay | Admitting: Family

## 2017-01-22 ENCOUNTER — Other Ambulatory Visit: Payer: Self-pay | Admitting: Family

## 2017-01-22 DIAGNOSIS — R928 Other abnormal and inconclusive findings on diagnostic imaging of breast: Secondary | ICD-10-CM

## 2017-01-22 NOTE — Telephone Encounter (Signed)
I spoke with Beverly Ingram and went over her mammogram and left lower extremity results in detail. She will be waiting to hear from the breast center to schedule biopsy. She will contact our office if she does not hear back from them after a few days. She verbalized understanding of the results and all questions were answered.

## 2017-01-29 ENCOUNTER — Ambulatory Visit
Admission: RE | Admit: 2017-01-29 | Discharge: 2017-01-29 | Disposition: A | Discharge status: Home / Self Care | Payer: BLUE CROSS/BLUE SHIELD | Source: Ambulatory Visit | Attending: Family | Admitting: Family

## 2017-01-29 DIAGNOSIS — R928 Other abnormal and inconclusive findings on diagnostic imaging of breast: Secondary | ICD-10-CM

## 2017-01-29 DIAGNOSIS — N6489 Other specified disorders of breast: Secondary | ICD-10-CM | POA: Diagnosis not present

## 2017-02-19 DIAGNOSIS — G5603 Carpal tunnel syndrome, bilateral upper limbs: Secondary | ICD-10-CM | POA: Diagnosis not present

## 2017-03-12 ENCOUNTER — Ambulatory Visit (HOSPITAL_BASED_OUTPATIENT_CLINIC_OR_DEPARTMENT_OTHER): Payer: BLUE CROSS/BLUE SHIELD | Admitting: Family

## 2017-03-12 ENCOUNTER — Encounter: Payer: Self-pay | Admitting: Family

## 2017-03-12 ENCOUNTER — Other Ambulatory Visit (HOSPITAL_BASED_OUTPATIENT_CLINIC_OR_DEPARTMENT_OTHER): Payer: BLUE CROSS/BLUE SHIELD

## 2017-03-12 ENCOUNTER — Other Ambulatory Visit: Payer: Self-pay

## 2017-03-12 VITALS — BP 109/69 | HR 63 | Temp 97.8°F | Resp 16 | Wt 217.8 lb

## 2017-03-12 DIAGNOSIS — Z1239 Encounter for other screening for malignant neoplasm of breast: Secondary | ICD-10-CM

## 2017-03-12 DIAGNOSIS — I82432 Acute embolism and thrombosis of left popliteal vein: Secondary | ICD-10-CM

## 2017-03-12 DIAGNOSIS — Z1231 Encounter for screening mammogram for malignant neoplasm of breast: Secondary | ICD-10-CM | POA: Diagnosis not present

## 2017-03-12 DIAGNOSIS — Z7901 Long term (current) use of anticoagulants: Secondary | ICD-10-CM

## 2017-03-12 LAB — CBC WITH DIFFERENTIAL (CANCER CENTER ONLY)
BASO#: 0.1 10*3/uL (ref 0.0–0.2)
BASO%: 0.6 % (ref 0.0–2.0)
EOS%: 2.7 % (ref 0.0–7.0)
Eosinophils Absolute: 0.2 10*3/uL (ref 0.0–0.5)
HCT: 39 % (ref 34.8–46.6)
HEMOGLOBIN: 12.8 g/dL (ref 11.6–15.9)
LYMPH#: 2 10*3/uL (ref 0.9–3.3)
LYMPH%: 25.3 % (ref 14.0–48.0)
MCH: 31.7 pg (ref 26.0–34.0)
MCHC: 32.8 g/dL (ref 32.0–36.0)
MCV: 97 fL (ref 81–101)
MONO#: 0.5 10*3/uL (ref 0.1–0.9)
MONO%: 6.4 % (ref 0.0–13.0)
NEUT%: 65 % (ref 39.6–80.0)
NEUTROS ABS: 5.2 10*3/uL (ref 1.5–6.5)
PLATELETS: 226 10*3/uL (ref 145–400)
RBC: 4.04 10*6/uL (ref 3.70–5.32)
RDW: 12.8 % (ref 11.1–15.7)
WBC: 8.1 10*3/uL (ref 3.9–10.0)

## 2017-03-12 LAB — COMPREHENSIVE METABOLIC PANEL
ALT: 38 U/L (ref 0–55)
ANION GAP: 8 meq/L (ref 3–11)
AST: 28 U/L (ref 5–34)
Albumin: 4.1 g/dL (ref 3.5–5.0)
Alkaline Phosphatase: 69 U/L (ref 40–150)
BILIRUBIN TOTAL: 0.25 mg/dL (ref 0.20–1.20)
BUN: 14.4 mg/dL (ref 7.0–26.0)
CO2: 27 meq/L (ref 22–29)
CREATININE: 0.8 mg/dL (ref 0.6–1.1)
Calcium: 9.4 mg/dL (ref 8.4–10.4)
Chloride: 107 mEq/L (ref 98–109)
EGFR: >60 mL/min/{1.73_m2} (ref >60)
Glucose: 112 mg/dl (ref 70–140)
Potassium: 4.7 mEq/L (ref 3.5–5.1)
Sodium: 142 mEq/L (ref 136–145)
TOTAL PROTEIN: 7.3 g/dL (ref 6.4–8.3)

## 2017-03-12 NOTE — Progress Notes (Signed)
Hematology and Oncology Follow Up Visit  Beverly Ingram [id number] Nov 12, 1965 51 y.o. 03/12/2017   Principle Diagnosis:  Left lower extremity DVT  Current Therapy:   Pradaxa 150 mg PO BID   Interim History:  Beverly Ingram is here today for follow-up. She continues to do well on Pradaxa BID and has had no episodes of bleeding, bruising or petechiae.  US in October showed a nonocclusive deep venous thrombosis in the left superficial femoral and popliteal veins.  No swelling or tenderness in her extremities. She wears her support stockings daily.  No fever, chills, n/v, cough, rash, dizziness, SOB, chest pain, palpitations, abdominal pain or changes in bowel or bladder habits.  She still has the intermittent numbness and tingling in her hands which is felt to be due to carpal tunnel syndrome. She is wearing wrist braces at night for support.  She has maintained a good appetite and is staying well hydrated. Her weight is stable.   ECOG Performance Status: 1 - Symptomatic but completely ambulatory  Medications:  Allergies as of 03/12/2017      Reactions   No Known Allergies       Medication List        Accurate as of 03/12/17  3:46 PM. Always use your most recent med list.          cetirizine 10 MG chewable tablet Commonly known as:  ZYRTEC Chew 10 mg by mouth daily.   dabigatran 150 MG Caps capsule Commonly known as:  PRADAXA Take 1 capsule (150 mg total) by mouth 2 (two) times daily.   Lysine 1000 MG Tabs Take by mouth.   meloxicam 15 MG tablet Commonly known as:  MOBIC TAKE 1/2 TO 1 TABLET ONCE A DAY ORALLY   PROBIOTIC PO Take 1 tablet by mouth daily.   sertraline 50 MG tablet Commonly known as:  ZOLOFT Take 50 mg by mouth daily.   vitamin B-12 1000 MCG tablet Commonly known as:  CYANOCOBALAMIN Take 1,000 mcg by mouth daily.       Allergies:  Allergies  Allergen Reactions  . No Known Allergies     Past Medical History, Surgical history, Social history,  and Family History were reviewed and updated.  Review of Systems: All other 10 point review of systems is negative.   Physical Exam:  weight is 217 lb 12.8 oz (98.8 kg). Her oral temperature is 97.8 F (36.6 C). Her blood pressure is 109/69 and her pulse is 63. Her respiration is 16 and oxygen saturation is 100%.   Wt Readings from Last 3 Encounters:  03/12/17 217 lb 12.8 oz (98.8 kg)  01/15/17 212 lb (96.2 kg)  12/18/16 210 lb (95.3 kg)    Ocular: Sclerae unicteric, pupils equal, round and reactive to light Ear-nose-throat: Oropharynx clear, dentition fair Lymphatic: No cervical, supraclavicular or axillary adenopathy Lungs no rales or rhonchi, good excursion bilaterally Heart regular rate and rhythm, no murmur appreciated Abd soft, nontender, positive bowel sounds, no liver or spleen tip palpated on exam, no fluid wave  MSK no focal spinal tenderness, no joint edema Neuro: non-focal, well-oriented, appropriate affect Breasts: Deferred   Lab Results  Component Value Date   WBC 8.1 03/12/2017   HGB 12.8 03/12/2017   HCT 39.0 03/12/2017   MCV 97 03/12/2017   PLT 226 03/12/2017   No results found for: FERRITIN, IRON, TIBC, UIBC, IRONPCTSAT Lab Results  Component Value Date   RBC 4.04 03/12/2017   No results found for: KPAFRELGTCHN, LAMBDASER, KAPLAMBRATIO No  results found for: IGGSERUM, IGA, IGMSERUM No results found for: Marda StalkerOTALPROTELP, ALBUMINELP, A1GS, A2GS, BETS, BETA2SER, GAMS, MSPIKE, SPEI   Chemistry      Component Value Date/Time   NA 142 03/12/2017 0843   K 4.7 03/12/2017 0843   CL 107 01/15/2017 0801   CO2 27 03/12/2017 0843   BUN 14.4 03/12/2017 0843   CREATININE 0.8 03/12/2017 0843      Component Value Date/Time   CALCIUM 9.4 03/12/2017 0843   ALKPHOS 69 03/12/2017 0843   AST 28 03/12/2017 0843   ALT 38 03/12/2017 0843   BILITOT 0.25 03/12/2017 0843      Impression and Plan: Beverly Ingram is a very pleasant 51 yo caucasian female with DVT or the left  lower extremity after knee surgery and stopping estrogen therapy. She continues to do well on Pradaxa and US last month showed a now nonocclusive deep venous thrombosis in the left superficial femoral and popliteal veins.  We will have her continue on her same regimen.  We will plan to see her back again in another 2 months for follow-up, lab and repeat US that same day.  She will contact our office with any questions or concerns. We can certainly see her sooner if need be.    Verdie MosherINCINNATI,SARAH M, NP 11/20/20183:46 PM

## 2017-03-13 LAB — D-DIMER, QUANTITATIVE: D-DIMER: 0.35 mg/L FEU (ref 0.00–0.49)

## 2017-05-14 ENCOUNTER — Other Ambulatory Visit: Payer: BLUE CROSS/BLUE SHIELD

## 2017-05-14 ENCOUNTER — Ambulatory Visit: Payer: BLUE CROSS/BLUE SHIELD | Admitting: Family

## 2017-06-07 ENCOUNTER — Inpatient Hospital Stay: Payer: BLUE CROSS/BLUE SHIELD | Attending: Hematology & Oncology

## 2017-06-07 ENCOUNTER — Encounter: Payer: Self-pay | Admitting: Family

## 2017-06-07 ENCOUNTER — Inpatient Hospital Stay (HOSPITAL_BASED_OUTPATIENT_CLINIC_OR_DEPARTMENT_OTHER): Payer: BLUE CROSS/BLUE SHIELD | Admitting: Family

## 2017-06-07 ENCOUNTER — Other Ambulatory Visit: Payer: Self-pay

## 2017-06-07 ENCOUNTER — Ambulatory Visit (HOSPITAL_BASED_OUTPATIENT_CLINIC_OR_DEPARTMENT_OTHER)
Admission: RE | Admit: 2017-06-07 | Discharge: 2017-06-07 | Disposition: A | Discharge status: Home / Self Care | Payer: BLUE CROSS/BLUE SHIELD | Source: Ambulatory Visit | Attending: Family | Admitting: Family

## 2017-06-07 VITALS — BP 129/67 | HR 61 | Temp 98.1°F | Resp 18 | Wt 216.8 lb

## 2017-06-07 DIAGNOSIS — I82432 Acute embolism and thrombosis of left popliteal vein: Secondary | ICD-10-CM | POA: Insufficient documentation

## 2017-06-07 DIAGNOSIS — Z79899 Other long term (current) drug therapy: Secondary | ICD-10-CM

## 2017-06-07 DIAGNOSIS — Z7902 Long term (current) use of antithrombotics/antiplatelets: Secondary | ICD-10-CM | POA: Diagnosis not present

## 2017-06-07 DIAGNOSIS — Z7901 Long term (current) use of anticoagulants: Secondary | ICD-10-CM | POA: Diagnosis not present

## 2017-06-07 DIAGNOSIS — I82412 Acute embolism and thrombosis of left femoral vein: Secondary | ICD-10-CM | POA: Insufficient documentation

## 2017-06-07 LAB — CBC WITH DIFFERENTIAL (CANCER CENTER ONLY)
BASOS PCT: 1 %
Basophils Absolute: 0.1 10*3/uL (ref 0.0–0.1)
EOS ABS: 0.3 10*3/uL (ref 0.0–0.5)
Eosinophils Relative: 3 %
HCT: 40.4 % (ref 34.8–46.6)
HEMOGLOBIN: 13.2 g/dL (ref 11.6–15.9)
Lymphocytes Relative: 21 %
Lymphs Abs: 2 10*3/uL (ref 0.9–3.3)
MCH: 31.2 pg (ref 26.0–34.0)
MCHC: 32.7 g/dL (ref 32.0–36.0)
MCV: 95.5 fL (ref 81.0–101.0)
Monocytes Absolute: 0.9 10*3/uL (ref 0.1–0.9)
Monocytes Relative: 10 %
NEUTROS PCT: 65 %
Neutro Abs: 6.1 10*3/uL (ref 1.5–6.5)
Platelet Count: 242 10*3/uL (ref 145–400)
RBC: 4.23 MIL/uL (ref 3.70–5.32)
RDW: 13.3 % (ref 11.1–15.7)
WBC: 9.2 10*3/uL (ref 3.9–10.0)

## 2017-06-07 LAB — D-DIMER, QUANTITATIVE (NOT AT ARMC): D DIMER QUANT: 0.49 ug{FEU}/mL (ref 0.00–0.50)

## 2017-06-07 LAB — CMP (CANCER CENTER ONLY)
ALT: 60 U/L — ABNORMAL HIGH (ref 0–55)
ANION GAP: 8 (ref 3–11)
AST: 39 U/L — ABNORMAL HIGH (ref 5–34)
Albumin: 4.3 g/dL (ref 3.5–5.0)
Alkaline Phosphatase: 80 U/L (ref 40–150)
BUN: 18 mg/dL (ref 7–26)
CALCIUM: 10 mg/dL (ref 8.4–10.4)
CO2: 25 mmol/L (ref 22–29)
Chloride: 107 mmol/L (ref 98–109)
Creatinine: 0.86 mg/dL (ref 0.60–1.10)
Glucose, Bld: 91 mg/dL (ref 70–140)
Potassium: 5.2 mmol/L — ABNORMAL HIGH (ref 3.5–5.1)
Sodium: 140 mmol/L (ref 136–145)
Total Bilirubin: 0.3 mg/dL (ref 0.2–1.2)
Total Protein: 7.5 g/dL (ref 6.4–8.3)

## 2017-06-07 NOTE — Progress Notes (Signed)
Hematology and Oncology Follow Up Visit  Navi Ewton [id number] 1965-05-06 52 y.o. 06/07/2017   Principle Diagnosis:  Left lower extremity DVT  Current Therapy:   Pradaxa 150 mg PO BID   Interim History:  Ms. Giebel is here today for follow-up. She is doing well and has no new complaints at this time. She still has occasional twinges of pain in the left lower extremity. [place] today showed stable nonocclusive DVT in the left femoral and poplitial veins.  No swelling, tenderness, numbness or tingling in her extremities at this time.  She is doing well on Pradaxa and taking as prescribed. No episodes of bleeding, no bruising or petechiae.  No lymphadenopathy found on exam.  No fever, chills, n/v, cough, rash, dizziness, SOB, chest pain, palpitations, abdominal pain or changes in bowel or bladder habits.   She has a good appetite and is staying well hydrated. Her weight is stable.  She is staying active working out daily.   ECOG Performance Status: 1 - Symptomatic but completely ambulatory  Medications:  Allergies as of 06/07/2017      Reactions   No Known Allergies       Medication List        Accurate as of 06/07/17 10:50 AM. Always use your most recent med list.          cetirizine 10 MG chewable tablet Commonly known as:  ZYRTEC Chew 10 mg by mouth daily.   dabigatran 150 MG Caps capsule Commonly known as:  PRADAXA Take 1 capsule (150 mg total) by mouth 2 (two) times daily.   Lysine 1000 MG Tabs Take by mouth.   meloxicam 15 MG tablet Commonly known as:  MOBIC TAKE 1/2 TO 1 TABLET ONCE A DAY ORALLY   PROBIOTIC PO Take 1 tablet by mouth daily.   sertraline 50 MG tablet Commonly known as:  ZOLOFT Take 50 mg by mouth daily.   traMADol 50 MG tablet Commonly known as:  ULTRAM Take 50 mg by mouth every 6 (six) hours as needed.   vitamin B-12 1000 MCG tablet Commonly known as:  CYANOCOBALAMIN Take 1,000 mcg by mouth daily.       Allergies:  Allergies    Allergen Reactions  . No Known Allergies     Past Medical History, Surgical history, Social history, and Family History were reviewed and updated.  Review of Systems: All other 10 point review of systems is negative.   Physical Exam:  weight is 216 lb 12.8 oz (98.3 kg). Her oral temperature is 98.1 F (36.7 C). Her blood pressure is 129/67 and her pulse is 61. Her respiration is 18 and oxygen saturation is 100%.   Wt Readings from Last 3 Encounters:  06/07/17 216 lb 12.8 oz (98.3 kg)  03/12/17 217 lb 12.8 oz (98.8 kg)  01/15/17 212 lb (96.2 kg)    Ocular: Sclerae unicteric, pupils equal, round and reactive to light Ear-nose-throat: Oropharynx clear, dentition fair Lymphatic: No cervical, supraclavicular or axillary adenopathy Lungs no rales or rhonchi, good excursion bilaterally Heart regular rate and rhythm, no murmur appreciated Abd soft, nontender, positive bowel sounds, no liver or spleen tip palpated on exam, no fluid wave  MSK no focal spinal tenderness, no joint edema Neuro: non-focal, well-oriented, appropriate affect Breasts: Deferred  Lab Results  Component Value Date   WBC 9.2 06/07/2017   HGB 12.8 03/12/2017   HCT 40.4 06/07/2017   MCV 95.5 06/07/2017   PLT 242 06/07/2017   No results found for: FERRITIN,  IRON, TIBC, UIBC, IRONPCTSAT Lab Results  Component Value Date   RBC 4.23 06/07/2017   No results found for: KPAFRELGTCHN, LAMBDASER, KAPLAMBRATIO No results found for: IGGSERUM, IGA, IGMSERUM No results found for: Marda StalkerOTALPROTELP, ALBUMINELP, A1GS, A2GS, BETS, BETA2SER, GAMS, MSPIKE, SPEI   Chemistry      Component Value Date/Time   NA 142 03/12/2017 0843   K 4.7 03/12/2017 0843   CL 107 01/15/2017 0801   CO2 27 03/12/2017 0843   BUN 14.4 03/12/2017 0843   CREATININE 0.8 03/12/2017 0843      Component Value Date/Time   CALCIUM 9.4 03/12/2017 0843   ALKPHOS 69 03/12/2017 0843   AST 28 03/12/2017 0843   ALT 38 03/12/2017 0843   BILITOT 0.25  03/12/2017 0843      Impression and Plan: [name] is a very pleasant [age] yo caucasian female with DVT of the left lower extremity. US today showed stable nonocclusive DVT of the left femoral and popliteal veins.  She is tolerating Pradaxa nicely and will continue her same regimen.  We will plan to see her back in another 3 months for follow-up and repeat US.  She will contact our office with any questions or concerns. We can certainly see her sooner if need be.   Emeline GinsSarah Omaria Plunk, NP 2/15/201910:50 AM

## 2017-07-02 DIAGNOSIS — F419 Anxiety disorder, unspecified: Secondary | ICD-10-CM | POA: Diagnosis not present

## 2017-07-02 DIAGNOSIS — I825Y2 Chronic embolism and thrombosis of unspecified deep veins of left proximal lower extremity: Secondary | ICD-10-CM | POA: Diagnosis not present

## 2017-07-02 DIAGNOSIS — Z0000 Encounter for general adult medical examination without abnormal findings: Secondary | ICD-10-CM | POA: Diagnosis not present

## 2017-07-02 DIAGNOSIS — Z13220 Encounter for screening for lipoid disorders: Secondary | ICD-10-CM | POA: Diagnosis not present

## 2017-07-02 DIAGNOSIS — G4700 Insomnia, unspecified: Secondary | ICD-10-CM | POA: Diagnosis not present

## 2017-07-10 DIAGNOSIS — Z1211 Encounter for screening for malignant neoplasm of colon: Secondary | ICD-10-CM | POA: Diagnosis not present

## 2017-07-10 DIAGNOSIS — Z1212 Encounter for screening for malignant neoplasm of rectum: Secondary | ICD-10-CM | POA: Diagnosis not present

## 2017-09-06 ENCOUNTER — Telehealth: Payer: Self-pay | Admitting: *Deleted

## 2017-09-06 ENCOUNTER — Other Ambulatory Visit: Payer: Self-pay

## 2017-09-06 ENCOUNTER — Inpatient Hospital Stay: Payer: BLUE CROSS/BLUE SHIELD

## 2017-09-06 ENCOUNTER — Inpatient Hospital Stay: Payer: BLUE CROSS/BLUE SHIELD | Attending: Family | Admitting: Family

## 2017-09-06 ENCOUNTER — Ambulatory Visit (HOSPITAL_BASED_OUTPATIENT_CLINIC_OR_DEPARTMENT_OTHER)
Admission: RE | Admit: 2017-09-06 | Discharge: 2017-09-06 | Disposition: A | Discharge status: Home / Self Care | Payer: BLUE CROSS/BLUE SHIELD | Source: Ambulatory Visit | Attending: Family | Admitting: Family

## 2017-09-06 ENCOUNTER — Encounter: Payer: Self-pay | Admitting: Family

## 2017-09-06 VITALS — BP 123/74 | HR 64 | Temp 98.1°F | Resp 16 | Wt 219.0 lb

## 2017-09-06 DIAGNOSIS — I82512 Chronic embolism and thrombosis of left femoral vein: Secondary | ICD-10-CM | POA: Diagnosis not present

## 2017-09-06 DIAGNOSIS — R200 Anesthesia of skin: Secondary | ICD-10-CM | POA: Diagnosis not present

## 2017-09-06 DIAGNOSIS — Z6379 Other stressful life events affecting family and household: Secondary | ICD-10-CM | POA: Diagnosis not present

## 2017-09-06 DIAGNOSIS — R201 Hypoesthesia of skin: Secondary | ICD-10-CM | POA: Diagnosis not present

## 2017-09-06 DIAGNOSIS — Z79899 Other long term (current) drug therapy: Secondary | ICD-10-CM | POA: Insufficient documentation

## 2017-09-06 DIAGNOSIS — I82433 Acute embolism and thrombosis of popliteal vein, bilateral: Secondary | ICD-10-CM | POA: Diagnosis not present

## 2017-09-06 DIAGNOSIS — R002 Palpitations: Secondary | ICD-10-CM | POA: Insufficient documentation

## 2017-09-06 DIAGNOSIS — I82432 Acute embolism and thrombosis of left popliteal vein: Secondary | ICD-10-CM

## 2017-09-06 DIAGNOSIS — Z09 Encounter for follow-up examination after completed treatment for conditions other than malignant neoplasm: Secondary | ICD-10-CM | POA: Diagnosis not present

## 2017-09-06 DIAGNOSIS — I82412 Acute embolism and thrombosis of left femoral vein: Secondary | ICD-10-CM | POA: Diagnosis not present

## 2017-09-06 DIAGNOSIS — R0789 Other chest pain: Secondary | ICD-10-CM | POA: Diagnosis not present

## 2017-09-06 DIAGNOSIS — Z7902 Long term (current) use of antithrombotics/antiplatelets: Secondary | ICD-10-CM | POA: Diagnosis not present

## 2017-09-06 LAB — CBC WITH DIFFERENTIAL (CANCER CENTER ONLY)
Basophils Absolute: 0 10*3/uL (ref 0.0–0.1)
Basophils Relative: 0 %
Eosinophils Absolute: 0.3 10*3/uL (ref 0.0–0.5)
Eosinophils Relative: 4 %
HEMATOCRIT: 41.5 % (ref 34.8–46.6)
HEMOGLOBIN: 13.6 g/dL (ref 11.6–15.9)
LYMPHS ABS: 1.6 10*3/uL (ref 0.9–3.3)
LYMPHS PCT: 17 %
MCH: 31.1 pg (ref 26.0–34.0)
MCHC: 32.8 g/dL (ref 32.0–36.0)
MCV: 94.7 fL (ref 81.0–101.0)
MONOS PCT: 8 %
Monocytes Absolute: 0.7 10*3/uL (ref 0.1–0.9)
NEUTROS ABS: 6.3 10*3/uL (ref 1.5–6.5)
NEUTROS PCT: 71 %
Platelet Count: 218 10*3/uL (ref 145–400)
RBC: 4.38 MIL/uL (ref 3.70–5.32)
RDW: 12.9 % (ref 11.1–15.7)
WBC Count: 9 10*3/uL (ref 3.9–10.0)

## 2017-09-06 LAB — CMP (CANCER CENTER ONLY)
ALK PHOS: 75 U/L (ref 26–84)
ALT: 47 U/L (ref 10–47)
AST: 35 U/L (ref 11–38)
Albumin: 3.9 g/dL (ref 3.5–5.0)
Anion gap: 7 (ref 5–15)
BILIRUBIN TOTAL: 0.7 mg/dL (ref 0.2–1.6)
BUN: 15 mg/dL (ref 7–22)
CHLORIDE: 104 mmol/L (ref 98–108)
CO2: 33 mmol/L (ref 18–33)
CREATININE: 0.8 mg/dL (ref 0.60–1.20)
Calcium: 9.8 mg/dL (ref 8.0–10.3)
Glucose, Bld: 88 mg/dL (ref 73–118)
Potassium: 4.6 mmol/L (ref 3.3–4.7)
Sodium: 144 mmol/L (ref 128–145)
TOTAL PROTEIN: 7.3 g/dL (ref 6.4–8.1)

## 2017-09-06 LAB — D-DIMER, QUANTITATIVE (NOT AT ARMC): D DIMER QUANT: 0.43 ug{FEU}/mL (ref 0.00–0.50)

## 2017-09-06 NOTE — Progress Notes (Signed)
Hematology and Oncology Follow Up Visit  Beverly Ingram [id number] [birth year] 52 y.o. 09/06/2017   Principle Diagnosis:  Left lower extremity DVT  Current Therapy:   Pradaxa 150 mg PO BID   Interim History:  Beverly Ingram is here today for follow-up. She has been under quite a bit of stress at home. Her sweet little 51 yo daughter was diagnosed with sensory processing disorder. This has been hard on everyone but they are working hard in OT to help her.  She has had a couple episodes of palpitations and twinges of chest discomfort over the last month which she attributes to stress. She promises to follow-up with her PCP if these persist or become more frequent.  We did an [place] today of the left lower extremity which showed no evidence of acute DVT and no change to slight improvement of chronic DVT primarily involving the left femoral vein.  She continues to do well on Pradaxa and has had no episodes of bleeding, no bruising or petechiae.  No fever, chills, n/v, cough, rash, dizziness, SOB, abdominal pain or changes in bowel or bladder habits.  No lymphadenopathy noted on exam.  No swelling or tenderness in her extremities at this time. She has occasional numbness and tingling in her fingertips.  She has a good appetite and is staying well hydrated. Her weight is stable.   ECOG Performance Status: 1 - Symptomatic but completely ambulatory  Medications:  Allergies as of 09/06/2017      Reactions   No Known Allergies       Medication List        Accurate as of 09/06/17 11:24 AM. Always use your most recent med list.          cetirizine 10 MG chewable tablet Commonly known as:  ZYRTEC Chew 10 mg by mouth daily.   dabigatran 150 MG Caps capsule Commonly known as:  PRADAXA Take 1 capsule (150 mg total) by mouth 2 (two) times daily.   Lysine 1000 MG Tabs Take by mouth.   meloxicam 15 MG tablet Commonly known as:  MOBIC TAKE 1/2 TO 1 TABLET ONCE A DAY ORALLY   PROBIOTIC PO Take 1  tablet by mouth daily.   sertraline 100 MG tablet Commonly known as:  ZOLOFT Take 100 mg by mouth daily.   traMADol 50 MG tablet Commonly known as:  ULTRAM Take 50 mg by mouth every 6 (six) hours as needed.   vitamin B-12 1000 MCG tablet Commonly known as:  CYANOCOBALAMIN Take 1,000 mcg by mouth daily.       Allergies:  Allergies  Allergen Reactions  . No Known Allergies     Past Medical History, Surgical history, Social history, and Family History were reviewed and updated.  Review of Systems: All other 10 point review of systems is negative.   Physical Exam:  weight is 219 lb (99.3 kg). Her oral temperature is 98.1 F (36.7 C). Her blood pressure is 123/74 and her pulse is 64. Her respiration is 16 and oxygen saturation is 100%.   Wt Readings from Last 3 Encounters:  09/06/17 219 lb (99.3 kg)  06/07/17 216 lb 12.8 oz (98.3 kg)  03/12/17 217 lb 12.8 oz (98.8 kg)    Ocular: Sclerae unicteric, pupils equal, round and reactive to light Ear-nose-throat: Oropharynx clear, dentition fair Lymphatic: No cervical, supraclavicular or axillary adenopathy Lungs no rales or rhonchi, good excursion bilaterally Heart regular rate and rhythm, no murmur appreciated Abd soft, nontender, positive bowel sounds, no liver  or spleen tip palpated on exam, no fluid wave  MSK no focal spinal tenderness, no joint edema Neuro: non-focal, well-oriented, appropriate affect Breasts: Deferred   Lab Results  Component Value Date   WBC 9.0 09/06/2017   HGB 13.6 09/06/2017   HCT 41.5 09/06/2017   MCV 94.7 09/06/2017   PLT 218 09/06/2017   No results found for: FERRITIN, IRON, TIBC, UIBC, IRONPCTSAT Lab Results  Component Value Date   RBC 4.38 09/06/2017   No results found for: KPAFRELGTCHN, LAMBDASER, KAPLAMBRATIO No results found for: Loel Lofty, IGMSERUM No results found for: Marda Stalker, SPEI   Chemistry      Component Value  Date/Time   NA 144 09/06/2017 0905   NA 142 03/12/2017 0843   K 4.6 09/06/2017 0905   K 4.7 03/12/2017 0843   CL 104 09/06/2017 0905   CL 107 01/15/2017 0801   CO2 33 09/06/2017 0905   CO2 27 03/12/2017 0843   BUN 15 09/06/2017 0905   BUN 14.4 03/12/2017 0843   CREATININE 0.80 09/06/2017 0905   CREATININE 0.8 03/12/2017 0843      Component Value Date/Time   CALCIUM 9.8 09/06/2017 0905   CALCIUM 9.4 03/12/2017 0843   ALKPHOS 75 09/06/2017 0905   ALKPHOS 69 03/12/2017 0843   AST 35 09/06/2017 0905   AST 28 03/12/2017 0843   ALT 47 09/06/2017 0905   ALT 38 03/12/2017 0843   BILITOT 0.7 09/06/2017 0905   BILITOT 0.25 03/12/2017 0843      Impression and Plan: Beverly Ingram is a very pleasant 52 yo caucasian female with 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 She continues to do well on Pradaxa BID so we will continue this same regimen.  We will plan to see her back in another 4 months for follow-up.  She will contact our office with any questions or concerns. We can certainly see her sooner if need be.   Emeline Gins, NP 5/17/201911:24 AM

## 2017-09-06 NOTE — Telephone Encounter (Addendum)
Patient is aware of results  ----- Message from Verdie Mosher, NP sent at 09/06/2017 11:50 AM EDT ----- Looks good, she still has a chronic non occlusive thrombus in the femoral vein, popliteal looked a little better. No [place] needed with follow-up at this time. Thank you!  ----- Message ----- From: Interface, Rad Results In Sent: 09/06/2017  10:38 AM To: Verdie Mosher, NP

## 2017-11-09 ENCOUNTER — Other Ambulatory Visit: Payer: Self-pay | Admitting: Hematology & Oncology

## 2017-11-09 DIAGNOSIS — I82432 Acute embolism and thrombosis of left popliteal vein: Secondary | ICD-10-CM

## 2018-01-10 ENCOUNTER — Other Ambulatory Visit: Payer: Self-pay

## 2018-01-10 ENCOUNTER — Inpatient Hospital Stay: Payer: BLUE CROSS/BLUE SHIELD | Attending: Family | Admitting: Family

## 2018-01-10 ENCOUNTER — Inpatient Hospital Stay: Payer: BLUE CROSS/BLUE SHIELD

## 2018-01-10 ENCOUNTER — Encounter: Payer: Self-pay | Admitting: Family

## 2018-01-10 VITALS — BP 123/74 | HR 72 | Temp 98.5°F | Resp 16 | Wt 219.0 lb

## 2018-01-10 DIAGNOSIS — I82412 Acute embolism and thrombosis of left femoral vein: Secondary | ICD-10-CM | POA: Insufficient documentation

## 2018-01-10 DIAGNOSIS — Z79899 Other long term (current) drug therapy: Secondary | ICD-10-CM | POA: Insufficient documentation

## 2018-01-10 DIAGNOSIS — M79605 Pain in left leg: Secondary | ICD-10-CM

## 2018-01-10 DIAGNOSIS — Z7902 Long term (current) use of antithrombotics/antiplatelets: Secondary | ICD-10-CM | POA: Insufficient documentation

## 2018-01-10 DIAGNOSIS — Z7901 Long term (current) use of anticoagulants: Secondary | ICD-10-CM | POA: Insufficient documentation

## 2018-01-10 DIAGNOSIS — M79662 Pain in left lower leg: Secondary | ICD-10-CM | POA: Insufficient documentation

## 2018-01-10 DIAGNOSIS — M25562 Pain in left knee: Secondary | ICD-10-CM | POA: Diagnosis not present

## 2018-01-10 DIAGNOSIS — I82432 Acute embolism and thrombosis of left popliteal vein: Secondary | ICD-10-CM | POA: Insufficient documentation

## 2018-01-10 LAB — CBC WITH DIFFERENTIAL (CANCER CENTER ONLY)
Basophils Absolute: 0.1 10*3/uL (ref 0.0–0.1)
Basophils Relative: 1 %
EOS PCT: 3 %
Eosinophils Absolute: 0.2 10*3/uL (ref 0.0–0.5)
HCT: 42.4 % (ref 34.8–46.6)
HEMOGLOBIN: 13.8 g/dL (ref 11.6–15.9)
LYMPHS ABS: 1.8 10*3/uL (ref 0.9–3.3)
LYMPHS PCT: 21 %
MCH: 30.9 pg (ref 26.0–34.0)
MCHC: 32.5 g/dL (ref 32.0–36.0)
MCV: 94.9 fL (ref 81.0–101.0)
Monocytes Absolute: 0.6 10*3/uL (ref 0.1–0.9)
Monocytes Relative: 7 %
NEUTROS PCT: 68 %
Neutro Abs: 5.9 10*3/uL (ref 1.5–6.5)
Platelet Count: 216 10*3/uL (ref 145–400)
RBC: 4.47 MIL/uL (ref 3.70–5.32)
RDW: 13 % (ref 11.1–15.7)
WBC: 8.7 10*3/uL (ref 3.9–10.0)

## 2018-01-10 LAB — CMP (CANCER CENTER ONLY)
ALBUMIN: 4.4 g/dL (ref 3.5–5.0)
ALT: 44 U/L (ref 0–44)
AST: 35 U/L (ref 15–41)
Alkaline Phosphatase: 79 U/L (ref 38–126)
Anion gap: 9 (ref 5–15)
BUN: 15 mg/dL (ref 6–20)
CHLORIDE: 104 mmol/L (ref 98–111)
CO2: 30 mmol/L (ref 22–32)
Calcium: 10 mg/dL (ref 8.9–10.3)
Creatinine: 0.93 mg/dL (ref 0.44–1.00)
GFR, Est AFR Am: >60 mL/min (ref >60)
Glucose, Bld: 105 mg/dL — ABNORMAL HIGH (ref 70–99)
POTASSIUM: 4.9 mmol/L (ref 3.5–5.1)
SODIUM: 143 mmol/L (ref 135–145)
Total Bilirubin: 0.3 mg/dL (ref 0.3–1.2)
Total Protein: 7.7 g/dL (ref 6.5–8.1)

## 2018-01-10 LAB — D-DIMER, QUANTITATIVE (NOT AT ARMC)

## 2018-01-10 MED ORDER — TRAMADOL HCL 50 MG PO TABS: 50.0000 mg | ORAL_TABLET | Freq: Four times a day (QID) | ORAL | 1 refills | Status: DC | PRN

## 2018-01-10 NOTE — Progress Notes (Signed)
Hematology and Oncology Follow Up Visit  Dale Strausser [id number] Dec 26, 1965 52 y.o. 01/10/2018   Principle Diagnosis:  Left lower extremity DVT  Current Therapy:   Pradaxa 150 mg PO BID   Interim History: Ms. Crosby is here today for follow-up. She is having pain behind her left knee and burning in her calf. Her left calf is a little larger than her right. She has not been wearing a compression stocking.  She takes Ultram in the evening for pain. Meloxicam did not seem to help so she discontinued this.   She verbalized that she is taking her Pradaxa BID as prescribed.  No bleeding, bruising or petechiae.  No fever, chills, n/v, cough, rash, dizziness, SOB, chest pain, palpitations, abdominal pain or changes in bowel or bladder habits. She has some numbness and tingling in her fingertips that comes and goes.  No lymphadenopathy noted on exam.  She has a good appetite and is staying well hydrated. Her weight is stable.   ECOG Performance Status: 1 - Symptomatic but completely ambulatory  Medications:  Allergies as of 01/10/2018      Reactions   No Known Allergies       Medication List        Accurate as of 01/10/18 10:52 AM. Always use your most recent med list.          cetirizine 10 MG chewable tablet Commonly known as:  ZYRTEC Chew 10 mg by mouth daily.   meloxicam 15 MG tablet Commonly known as:  MOBIC TAKE 1/2 TO 1 TABLET ONCE A DAY ORALLY   PRADAXA 150 MG Caps capsule Generic drug:  dabigatran TAKE 1 CAPSULE (150 MG TOTAL) BY MOUTH 2 (TWO) TIMES DAILY.   sertraline 100 MG tablet Commonly known as:  ZOLOFT Take 100 mg by mouth daily.   traMADol 50 MG tablet Commonly known as:  ULTRAM Take 50 mg by mouth every 6 (six) hours as needed.   vitamin B-12 1000 MCG tablet Commonly known as:  CYANOCOBALAMIN Take 1,000 mcg by mouth daily.       Allergies:  Allergies  Allergen Reactions  . No Known Allergies     Past Medical History, Surgical history,  Social history, and Family History were reviewed and updated.  Review of Systems: All other 10 point review of systems is negative.   Physical Exam:  weight is 219 lb (99.3 kg). Her oral temperature is 98.5 F (36.9 C). Her blood pressure is 123/74 and her pulse is 72. Her respiration is 16 and oxygen saturation is 100%.   Wt Readings from Last 3 Encounters:  01/10/18 219 lb (99.3 kg)  09/06/17 219 lb (99.3 kg)  06/07/17 216 lb 12.8 oz (98.3 kg)    Ocular: Sclerae unicteric, pupils equal, round and reactive to light Ear-nose-throat: Oropharynx clear, dentition fair Lymphatic: No cervical, supraclavicular or axillary adenopathy Lungs no rales or rhonchi, good excursion bilaterally Heart regular rate and rhythm, no murmur appreciated Abd soft, nontender, positive bowel sounds, no liver or spleen tip palpated on exam, no fluid wave  MSK no focal spinal tenderness, no joint edema Neuro: non-focal, well-oriented, appropriate affect Breasts: Deferred   Lab Results  Component Value Date   WBC 8.7 01/10/2018   HGB 13.8 01/10/2018   HCT 42.4 01/10/2018   MCV 94.9 01/10/2018   PLT 216 01/10/2018   No results found for: FERRITIN, IRON, TIBC, UIBC, IRONPCTSAT Lab Results  Component Value Date   RBC 4.47 01/10/2018   No results found for:  KPAFRELGTCHN, LAMBDASER, KAPLAMBRATIO No results found for: Loel LoftyGGSERUM, IGA, IGMSERUM No results found for: Marda StalkerOTALPROTELP, ALBUMINELP, A1GS, A2GS, BETS, BETA2SER, GAMS, MSPIKE, SPEI   Chemistry      Component Value Date/Time   NA 144 09/06/2017 0905   NA 142 03/12/2017 0843   K 4.6 09/06/2017 0905   K 4.7 03/12/2017 0843   CL 104 09/06/2017 0905   CL 107 01/15/2017 0801   CO2 33 09/06/2017 0905   CO2 27 03/12/2017 0843   BUN 15 09/06/2017 0905   BUN 14.4 03/12/2017 0843   CREATININE 0.80 09/06/2017 0905   CREATININE 0.8 03/12/2017 0843      Component Value Date/Time   CALCIUM 9.8 09/06/2017 0905   CALCIUM 9.4 03/12/2017 0843   ALKPHOS 75  09/06/2017 0905   ALKPHOS 69 03/12/2017 0843   AST 35 09/06/2017 0905   AST 28 03/12/2017 0843   ALT 47 09/06/2017 0905   ALT 38 03/12/2017 0843   BILITOT 0.7 09/06/2017 0905   BILITOT 0.25 03/12/2017 0843      Impression and Plan: 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 She is symptomatic with pain behind her left knee and burning in the calf.  We will get a repeat US to assess.  She will continue her same regimen with Pradaxa.  We will go ahead and plan to see her back in another 4 months for follow-up.  She will contact our office with any questions or concerns. We can certainly see her sooner if need be.   Emeline GinsSarah Zakyia Gagan, NP 9/20/201910:52 AM

## 2018-01-14 ENCOUNTER — Telehealth: Payer: Self-pay | Admitting: Family

## 2018-01-14 ENCOUNTER — Ambulatory Visit (HOSPITAL_BASED_OUTPATIENT_CLINIC_OR_DEPARTMENT_OTHER)
Admission: RE | Admit: 2018-01-14 | Discharge: 2018-01-14 | Disposition: A | Discharge status: Home / Self Care | Payer: BLUE CROSS/BLUE SHIELD | Source: Ambulatory Visit | Attending: Family | Admitting: Family

## 2018-01-14 DIAGNOSIS — I82512 Chronic embolism and thrombosis of left femoral vein: Secondary | ICD-10-CM | POA: Insufficient documentation

## 2018-01-14 DIAGNOSIS — M79662 Pain in left lower leg: Secondary | ICD-10-CM | POA: Diagnosis not present

## 2018-01-14 DIAGNOSIS — I82432 Acute embolism and thrombosis of left popliteal vein: Secondary | ICD-10-CM

## 2018-01-14 DIAGNOSIS — I82412 Acute embolism and thrombosis of left femoral vein: Secondary | ICD-10-CM | POA: Diagnosis not present

## 2018-01-14 DIAGNOSIS — I82532 Chronic embolism and thrombosis of left popliteal vein: Secondary | ICD-10-CM | POA: Diagnosis not present

## 2018-01-14 NOTE — Telephone Encounter (Signed)
I spoke with MS. Beverly Ingram and went over her Left lower extremity US results with her in detail. No acute or worsening chronic DVT. She states she will wear her compression stockings. We will plan to see her back in January for follow-up.

## 2018-02-06 ENCOUNTER — Other Ambulatory Visit: Payer: Self-pay | Admitting: Family

## 2018-02-06 DIAGNOSIS — I82432 Acute embolism and thrombosis of left popliteal vein: Secondary | ICD-10-CM

## 2018-02-06 MED ORDER — APIXABAN 5 MG PO TABS: 5.0000 mg | ORAL_TABLET | Freq: Two times a day (BID) | ORAL | 6 refills | Status: DC

## 2018-04-02 ENCOUNTER — Other Ambulatory Visit: Payer: Self-pay | Admitting: Family

## 2018-04-02 DIAGNOSIS — I82432 Acute embolism and thrombosis of left popliteal vein: Secondary | ICD-10-CM

## 2018-04-02 MED ORDER — MELOXICAM 15 MG PO TABS: ORAL_TABLET | ORAL | 1 refills | Status: DC

## 2018-04-21 ENCOUNTER — Telehealth: Payer: Self-pay | Admitting: Family

## 2018-04-21 ENCOUNTER — Other Ambulatory Visit: Payer: Self-pay | Admitting: Family

## 2018-04-21 DIAGNOSIS — I82432 Acute embolism and thrombosis of left popliteal vein: Secondary | ICD-10-CM

## 2018-04-21 DIAGNOSIS — M79605 Pain in left leg: Secondary | ICD-10-CM

## 2018-04-21 MED ORDER — TRAMADOL HCL 50 MG PO TABS: 50.0000 mg | ORAL_TABLET | Freq: Four times a day (QID) | ORAL | 1 refills | Status: DC | PRN

## 2018-04-21 NOTE — Telephone Encounter (Signed)
I spoke with Ms. 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 She has not been wearing her compression stocking and will start wearing during the day for added support.  She feels that the Mobic is giving her GERD to she will stop this and will go back to Ultram. She is taking Nexium to help prevent the GERD.  She will continues to hydrate well and will contact our office with any new questions or concerns.

## 2018-05-13 ENCOUNTER — Inpatient Hospital Stay: Payer: BLUE CROSS/BLUE SHIELD | Attending: Family | Admitting: Family

## 2018-05-13 ENCOUNTER — Other Ambulatory Visit: Payer: Self-pay

## 2018-05-13 ENCOUNTER — Encounter (INDEPENDENT_AMBULATORY_CARE_PROVIDER_SITE_OTHER): Payer: Self-pay

## 2018-05-13 ENCOUNTER — Inpatient Hospital Stay: Payer: BLUE CROSS/BLUE SHIELD

## 2018-05-13 VITALS — BP 121/67 | HR 78 | Temp 97.9°F | Resp 20 | Wt 219.8 lb

## 2018-05-13 DIAGNOSIS — Z7901 Long term (current) use of anticoagulants: Secondary | ICD-10-CM | POA: Insufficient documentation

## 2018-05-13 DIAGNOSIS — I82432 Acute embolism and thrombosis of left popliteal vein: Secondary | ICD-10-CM | POA: Diagnosis not present

## 2018-05-13 DIAGNOSIS — M79605 Pain in left leg: Secondary | ICD-10-CM

## 2018-05-13 DIAGNOSIS — Z79899 Other long term (current) drug therapy: Secondary | ICD-10-CM | POA: Diagnosis not present

## 2018-05-13 LAB — CMP (CANCER CENTER ONLY)
ALK PHOS: 55 U/L (ref 38–126)
ALT: 27 U/L (ref 0–44)
ANION GAP: 7 (ref 5–15)
AST: 22 U/L (ref 15–41)
Albumin: 4.8 g/dL (ref 3.5–5.0)
BUN: 18 mg/dL (ref 6–20)
CHLORIDE: 104 mmol/L (ref 98–111)
CO2: 30 mmol/L (ref 22–32)
CREATININE: 0.94 mg/dL (ref 0.44–1.00)
Calcium: 9.7 mg/dL (ref 8.9–10.3)
GFR, Estimated: >60 mL/min (ref >60)
Glucose, Bld: 95 mg/dL (ref 70–99)
Potassium: 4.2 mmol/L (ref 3.5–5.1)
SODIUM: 141 mmol/L (ref 135–145)
Total Bilirubin: 0.4 mg/dL (ref 0.3–1.2)
Total Protein: 7 g/dL (ref 6.5–8.1)

## 2018-05-13 LAB — CBC WITH DIFFERENTIAL (CANCER CENTER ONLY)
Abs Immature Granulocytes: 0.02 10*3/uL (ref 0.00–0.07)
BASOS ABS: 0.1 10*3/uL (ref 0.0–0.1)
BASOS PCT: 1 %
EOS ABS: 0.2 10*3/uL (ref 0.0–0.5)
EOS PCT: 2 %
HCT: 41.8 % (ref 36.0–46.0)
Hemoglobin: 13.7 g/dL (ref 12.0–15.0)
Immature Granulocytes: 0 %
Lymphocytes Relative: 19 %
Lymphs Abs: 1.4 10*3/uL (ref 0.7–4.0)
MCH: 31.1 pg (ref 26.0–34.0)
MCHC: 32.8 g/dL (ref 30.0–36.0)
MCV: 94.8 fL (ref 80.0–100.0)
Monocytes Absolute: 0.4 10*3/uL (ref 0.1–1.0)
Monocytes Relative: 6 %
NRBC: 0 % (ref 0.0–0.2)
Neutro Abs: 5.3 10*3/uL (ref 1.7–7.7)
Neutrophils Relative %: 72 %
Platelet Count: 231 10*3/uL (ref 150–400)
RBC: 4.41 MIL/uL (ref 3.87–5.11)
RDW: 12.7 % (ref 11.5–15.5)
WBC Count: 7.3 10*3/uL (ref 4.0–10.5)

## 2018-05-13 LAB — D-DIMER, QUANTITATIVE: D-Dimer, Quant: 0.44 ug/mL-FEU (ref 0.00–0.50)

## 2018-05-13 NOTE — Progress Notes (Signed)
Hematology and Oncology Follow Up Visit  Beverly Ingram [id number] 12-07-1965 53 y.o. 05/13/2018   Principle Diagnosis:  Left lower extremity DVT  Current Therapy:   Eliquis 5mg  PO BID   Interim History:  Beverly Ingram is here today for follow-up. She is doing well but still has some post phlebitic pain behind the left knee. She is wearing her compression stocking during the day which she feels has helped.  No swelling in her extremities at this time.  She notes that the numbness and tingling in her fingertips has improved since switching back to Eliquis.  She has had no issues with bleeding, no bruising or petechiae.  No lymphadenopathy noted on exam.  No fever, chills, n/v, cough, rash, dizziness, SOB, chest pain, palpitations, abdominal pain or changes in bowel or bladder habits.  She has maintained a good appetite and is staying well hydrated. Her weight is stable.   ECOG Performance Status: 1 - Symptomatic but completely ambulatory  Medications:  Allergies as of 05/13/2018      Reactions   No Known Allergies       Medication List       Accurate as of May 13, 2018  9:07 AM. Always use your most recent med list.        apixaban 5 MG Tabs tablet Commonly known as:  ELIQUIS Take 1 tablet (5 mg total) by mouth 2 (two) times daily.   cetirizine 10 MG chewable tablet Commonly known as:  ZYRTEC Chew 10 mg by mouth daily.   sertraline 100 MG tablet Commonly known as:  ZOLOFT Take 100 mg by mouth daily.   traMADol 50 MG tablet Commonly known as:  ULTRAM Take 1 tablet (50 mg total) by mouth every 6 (six) hours as needed.   vitamin B-12 1000 MCG tablet Commonly known as:  CYANOCOBALAMIN Take 1,000 mcg by mouth daily.       Allergies:  Allergies  Allergen Reactions  . No Known Allergies     Past Medical History, Surgical history, Social history, and Family History were reviewed and updated.  Review of Systems: All other 10 point review of systems is negative.     Physical Exam:  vitals were not taken for this visit.   Wt Readings from Last 3 Encounters:  01/10/18 219 lb (99.3 kg)  09/06/17 219 lb (99.3 kg)  06/07/17 216 lb 12.8 oz (98.3 kg)    Ocular: Sclerae unicteric, pupils equal, round and reactive to light Ear-nose-throat: Oropharynx clear, dentition fair Lymphatic: No cervical, supraclavicular or axillary adenopathy Lungs no rales or rhonchi, good excursion bilaterally Heart regular rate and rhythm, no murmur appreciated Abd soft, nontender, positive bowel sounds, no liver or spleen tip palpated on exam, no fluid wave MSK no focal spinal tenderness, no joint edema Neuro: non-focal, well-oriented, appropriate affect Breasts: Deferred   Lab Results  Component Value Date   WBC 7.3 05/13/2018   HGB 13.7 05/13/2018   HCT 41.8 05/13/2018   MCV 94.8 05/13/2018   PLT 231 05/13/2018   No results found for: FERRITIN, IRON, TIBC, UIBC, IRONPCTSAT Lab Results  Component Value Date   RBC 4.41 05/13/2018   No results found for: KPAFRELGTCHN, LAMBDASER, KAPLAMBRATIO No results found for: IGGSERUM, IGA, IGMSERUM No results found for: Dorene ArOTALPROTELP, ALBUMINELP, A1GS, A2GS, Colin BentonBETS, BETA2SER, GAMS, MSPIKE, SPEI   Chemistry      Component Value Date/Time   NA 143 01/10/2018 0950   NA 142 03/12/2017 0843   K 4.9 01/10/2018 0950   K  4.7 03/12/2017 0843   CL 104 01/10/2018 0950   CL 107 01/15/2017 0801   CO2 30 01/10/2018 0950   CO2 27 03/12/2017 0843   BUN 15 01/10/2018 0950   BUN 14.4 03/12/2017 0843   CREATININE 0.93 01/10/2018 0950   CREATININE 0.8 03/12/2017 0843      Component Value Date/Time   CALCIUM 10.0 01/10/2018 0950   CALCIUM 9.4 03/12/2017 0843   ALKPHOS 79 01/10/2018 0950   ALKPHOS 69 03/12/2017 0843   AST 35 01/10/2018 0950   AST 28 03/12/2017 0843   ALT 44 01/10/2018 0950   ALT 38 03/12/2017 0843   BILITOT 0.3 01/10/2018 0950   BILITOT 0.25 03/12/2017 0843       Impression and Plan: Beverly Ingram is a very  pleasant 53 yo caucasian female with 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  She continues to do well and has noted some improvement in her post phlebitic pain when using her compression stocking.  She will continue her same regimen with Eliquis.  We will plan to see her in another 4 months.  She will contact our office with any questions or concerns. We can certainly see her sooner if needed.   Emeline Gins, NP 1/21/20209:07 AM

## 2018-05-24 DIAGNOSIS — H6980 Other specified disorders of Eustachian tube, unspecified ear: Secondary | ICD-10-CM | POA: Diagnosis not present

## 2018-05-24 DIAGNOSIS — H6691 Otitis media, unspecified, right ear: Secondary | ICD-10-CM | POA: Diagnosis not present

## 2018-06-13 ENCOUNTER — Other Ambulatory Visit: Payer: Self-pay | Admitting: Family

## 2018-06-13 DIAGNOSIS — I82432 Acute embolism and thrombosis of left popliteal vein: Secondary | ICD-10-CM

## 2018-06-13 DIAGNOSIS — M79605 Pain in left leg: Secondary | ICD-10-CM

## 2018-06-16 DIAGNOSIS — J029 Acute pharyngitis, unspecified: Secondary | ICD-10-CM | POA: Diagnosis not present

## 2018-06-25 DIAGNOSIS — Z0000 Encounter for general adult medical examination without abnormal findings: Secondary | ICD-10-CM | POA: Diagnosis not present

## 2018-06-25 DIAGNOSIS — Z6836 Body mass index (BMI) 36.0-36.9, adult: Secondary | ICD-10-CM | POA: Diagnosis not present

## 2018-06-25 DIAGNOSIS — F411 Generalized anxiety disorder: Secondary | ICD-10-CM | POA: Diagnosis not present

## 2018-06-25 DIAGNOSIS — I82432 Acute embolism and thrombosis of left popliteal vein: Secondary | ICD-10-CM | POA: Diagnosis not present

## 2018-07-19 IMAGING — US US EXTREM LOW VENOUS*L*
1 series · 13 of 24 positions shown · non-contrast
Comparison: Left lower extremity venous Doppler ultrasound -
06/07/2017; 01/21/2017

CLINICAL DATA: Follow-up popliteal DVT.



[Series 1: us extrem low venous*left* · 0.08mm/px · 13 of 37 slices shown]
[im 1/37]
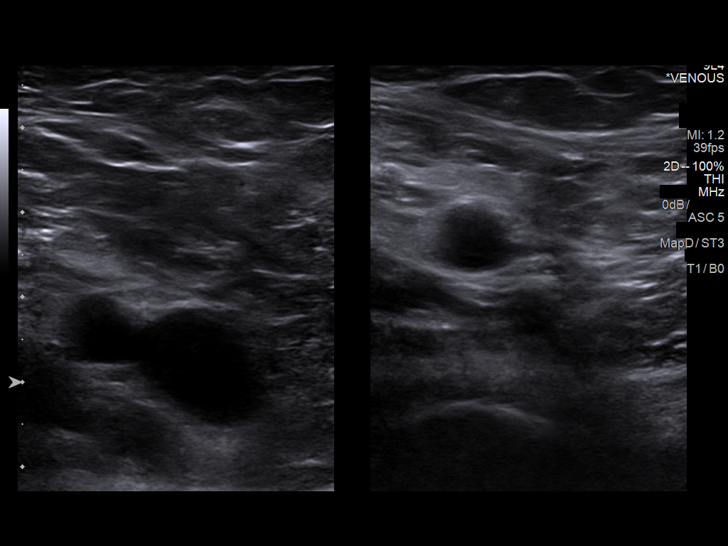
[im 4/37]
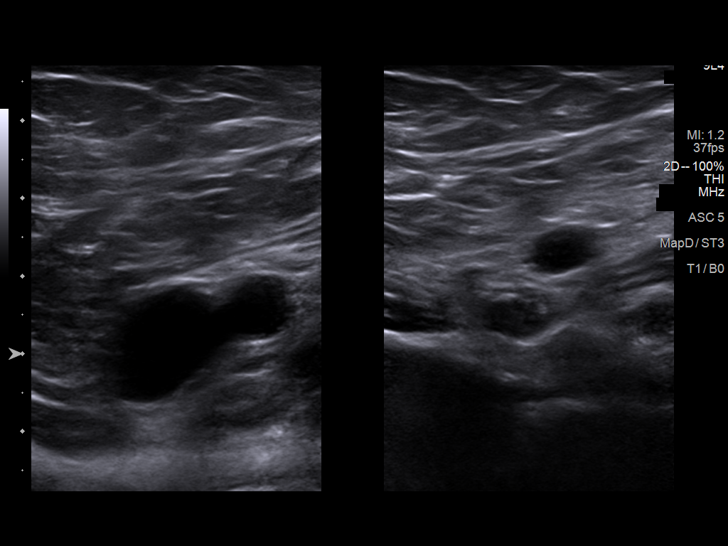
[im 7/37]
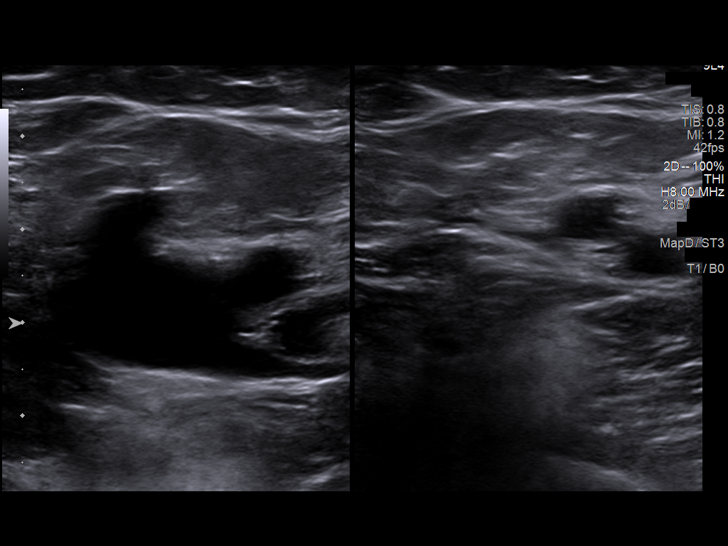
[im 10/37]
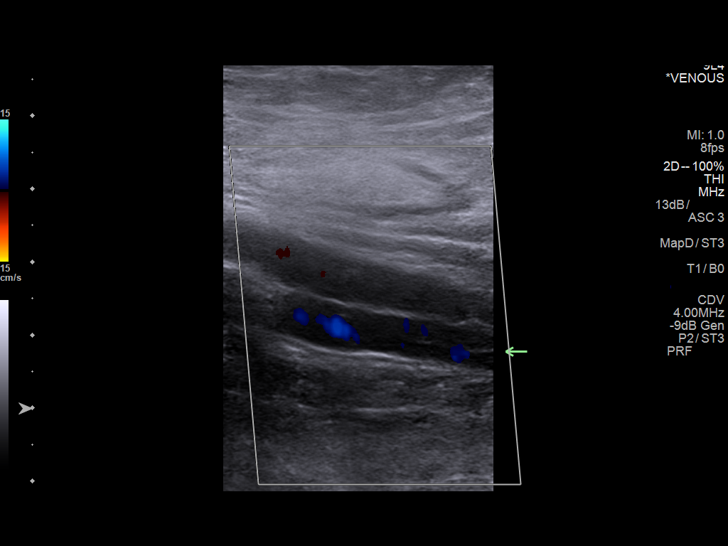
[im 13/37]
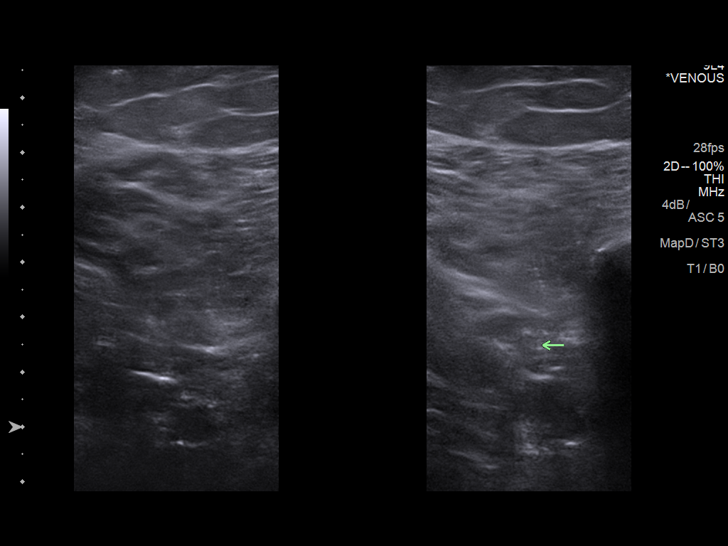
[im 16/37]
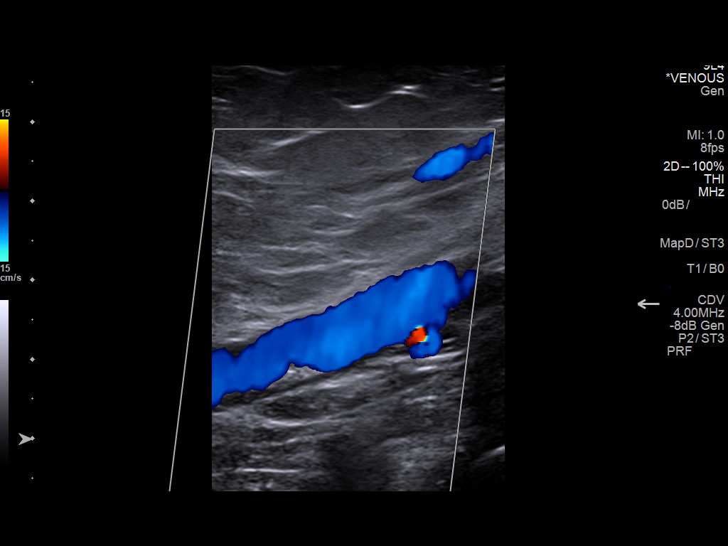
[im 19/37]
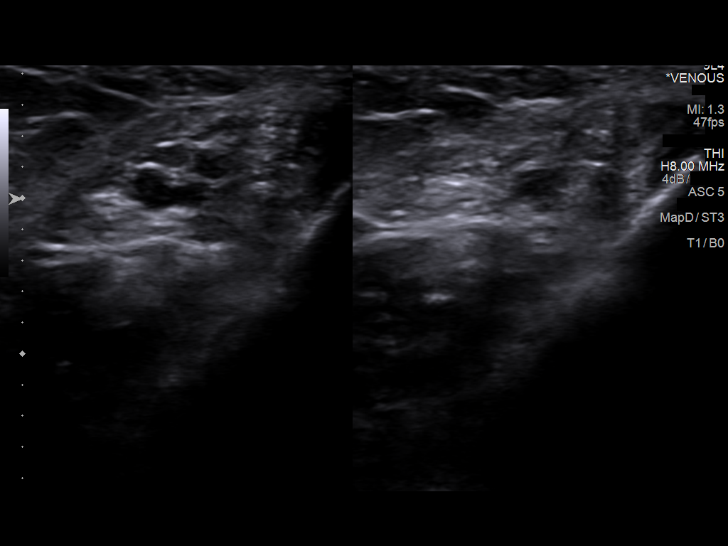
[im 21/37]
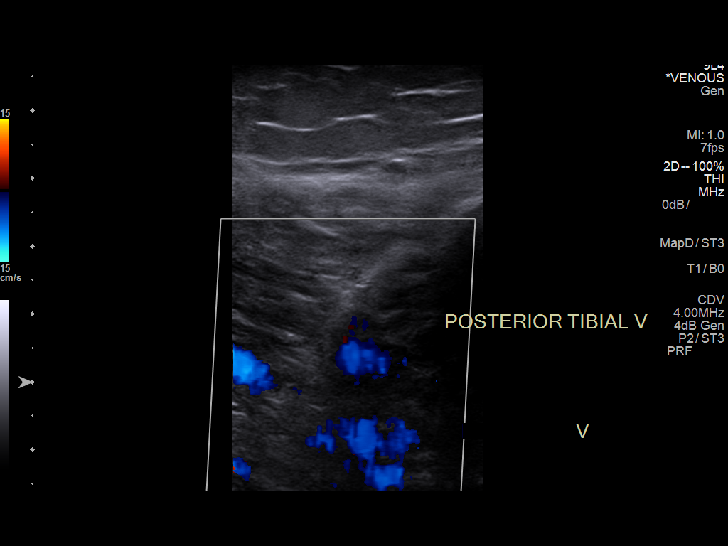
[im 24/37]
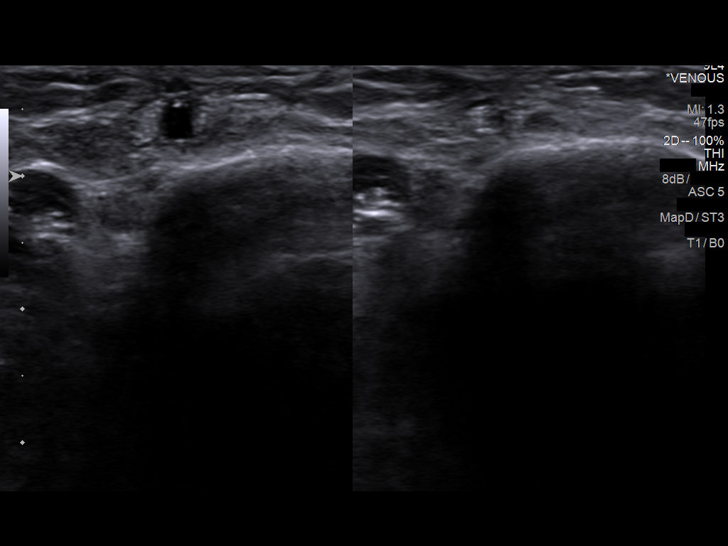
[im 27/37]
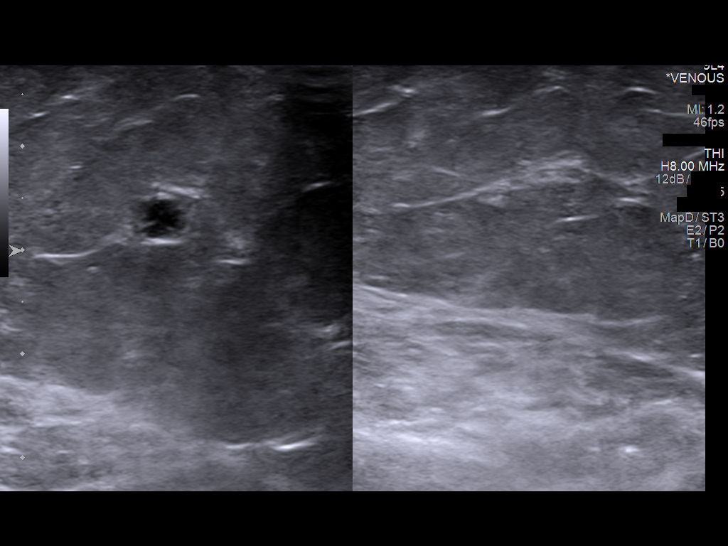
[im 30/37]
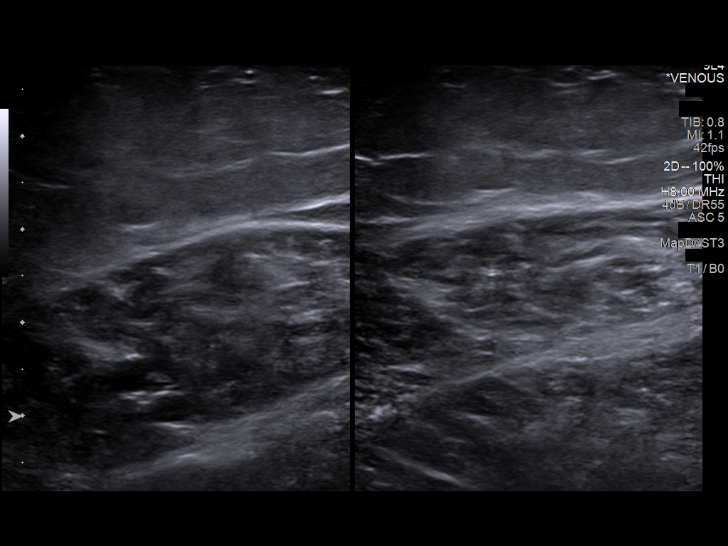
[im 33/37]
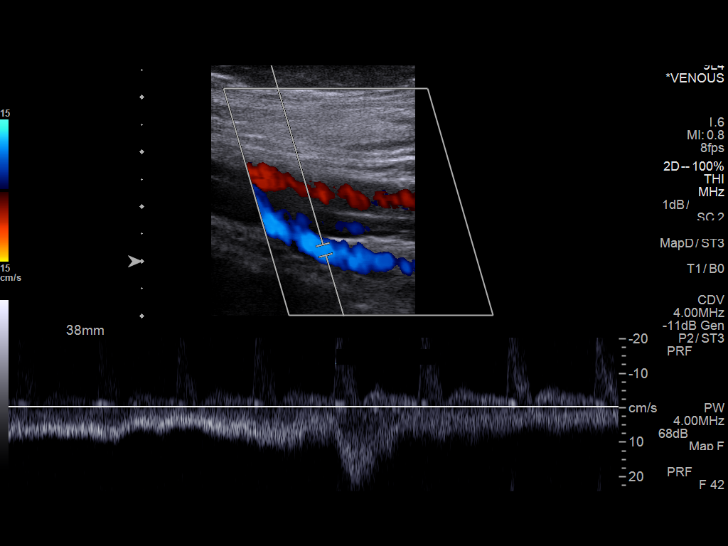
[im 37/37]
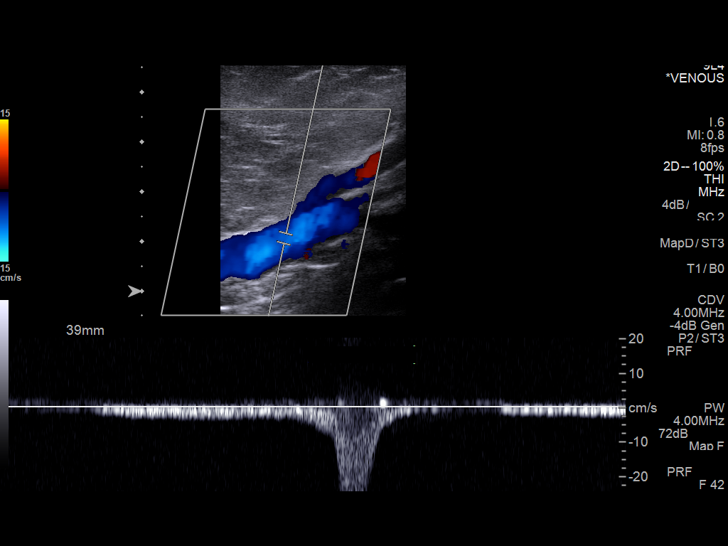

[13 of 24 positions shown; findings below may reference images not displayed]

FINDINGS: Contralateral Common Femoral Vein: Respiratory phasicity is normal
and symmetric with the symptomatic side. No evidence of thrombus.
Normal compressibility.

Common Femoral Vein: No evidence of thrombus. Normal
compressibility, respiratory phasicity and response to augmentation.

Saphenofemoral Junction: No evidence of thrombus. Normal
compressibility and flow on color Doppler imaging.

Profunda Femoral Vein: No evidence of acute or chronic thrombus.
Normal compressibility and flow on color Doppler imaging.

Superficial femoral vein: There is grossly unchanged hypoechoic near
occlusive thrombus with internal echogenic septations involving the
proximal (image 12 mid (image 14) aspects of the femoral vein. The
distal aspect of the left femoral vein appears patent (image 16),
likely improved from prior examination.

Popliteal vein: Grossly unchanged minimal amount of nonocclusive
wall thickening involving the left popliteal vein.

Calf veins: The tibial veins appear patent where imaged.

Superficial Great Saphenous Vein: No evidence of thrombus. Normal
compressibility.

Other Findings:  None.
IMPRESSION: 1. No evidence of acute DVT within the left lower extremity.
2. No change to slight improvement of chronic DVT primarily
involving the left femoral vein as detailed above.

## 2018-07-23 DIAGNOSIS — F411 Generalized anxiety disorder: Secondary | ICD-10-CM | POA: Diagnosis not present

## 2018-07-23 DIAGNOSIS — G2581 Restless legs syndrome: Secondary | ICD-10-CM | POA: Diagnosis not present

## 2018-08-21 ENCOUNTER — Other Ambulatory Visit: Payer: Self-pay | Admitting: Family

## 2018-08-21 DIAGNOSIS — M79605 Pain in left leg: Secondary | ICD-10-CM

## 2018-08-21 DIAGNOSIS — I82432 Acute embolism and thrombosis of left popliteal vein: Secondary | ICD-10-CM

## 2018-09-01 ENCOUNTER — Other Ambulatory Visit: Payer: Self-pay | Admitting: Family

## 2018-09-01 DIAGNOSIS — R1032 Left lower quadrant pain: Secondary | ICD-10-CM

## 2018-09-01 DIAGNOSIS — I82432 Acute embolism and thrombosis of left popliteal vein: Secondary | ICD-10-CM

## 2018-09-02 ENCOUNTER — Ambulatory Visit (HOSPITAL_BASED_OUTPATIENT_CLINIC_OR_DEPARTMENT_OTHER)
Admission: RE | Admit: 2018-09-02 | Discharge: 2018-09-02 | Disposition: A | Discharge status: Home / Self Care | Payer: BLUE CROSS/BLUE SHIELD | Source: Ambulatory Visit | Attending: Family | Admitting: Family

## 2018-09-02 ENCOUNTER — Other Ambulatory Visit: Payer: Self-pay | Admitting: Family

## 2018-09-02 ENCOUNTER — Other Ambulatory Visit: Payer: Self-pay

## 2018-09-02 DIAGNOSIS — I87009 Postthrombotic syndrome without complications of unspecified extremity: Secondary | ICD-10-CM

## 2018-09-02 DIAGNOSIS — R1032 Left lower quadrant pain: Secondary | ICD-10-CM | POA: Insufficient documentation

## 2018-09-02 DIAGNOSIS — I82432 Acute embolism and thrombosis of left popliteal vein: Secondary | ICD-10-CM | POA: Diagnosis not present

## 2018-09-02 DIAGNOSIS — I82512 Chronic embolism and thrombosis of left femoral vein: Secondary | ICD-10-CM

## 2018-09-02 DIAGNOSIS — I82401 Acute embolism and thrombosis of unspecified deep veins of right lower extremity: Secondary | ICD-10-CM | POA: Diagnosis not present

## 2018-09-03 ENCOUNTER — Telehealth: Payer: Self-pay | Admitting: *Deleted

## 2018-09-03 NOTE — Telephone Encounter (Signed)
Call received from patient requesting [place] results from yesterday and wanting to get information about upcoming appt to [facility].  Call placed back to patient and patient notified per order of S. [place] NP, that [place] is unchanged and that appt with Nance Pear is for a consult re: a procedure for chronic lft DVT and post thrombotic syndrome.  Patient appreciative of call back and has no further questions at this time.

## 2018-09-09 ENCOUNTER — Inpatient Hospital Stay: Payer: BLUE CROSS/BLUE SHIELD

## 2018-09-09 ENCOUNTER — Inpatient Hospital Stay: Payer: BLUE CROSS/BLUE SHIELD | Attending: Family | Admitting: Family

## 2018-09-09 ENCOUNTER — Telehealth: Payer: Self-pay | Admitting: Family

## 2018-09-09 ENCOUNTER — Other Ambulatory Visit: Payer: Self-pay

## 2018-09-09 VITALS — BP 125/84 | HR 80 | Temp 98.0°F | Resp 20 | Wt 228.0 lb

## 2018-09-09 DIAGNOSIS — I82512 Chronic embolism and thrombosis of left femoral vein: Secondary | ICD-10-CM | POA: Insufficient documentation

## 2018-09-09 DIAGNOSIS — M79662 Pain in left lower leg: Secondary | ICD-10-CM | POA: Insufficient documentation

## 2018-09-09 DIAGNOSIS — Z7901 Long term (current) use of anticoagulants: Secondary | ICD-10-CM | POA: Diagnosis not present

## 2018-09-09 DIAGNOSIS — I82432 Acute embolism and thrombosis of left popliteal vein: Secondary | ICD-10-CM

## 2018-09-09 LAB — CMP (CANCER CENTER ONLY)
ALT: 22 U/L (ref 0–44)
AST: 18 U/L (ref 15–41)
Albumin: 4.7 g/dL (ref 3.5–5.0)
Alkaline Phosphatase: 61 U/L (ref 38–126)
Anion gap: 7 (ref 5–15)
BUN: 14 mg/dL (ref 6–20)
CO2: 31 mmol/L (ref 22–32)
Calcium: 9.7 mg/dL (ref 8.9–10.3)
Chloride: 104 mmol/L (ref 98–111)
Creatinine: 0.95 mg/dL (ref 0.44–1.00)
GFR, Est AFR Am: >60 mL/min (ref >60)
GFR, Estimated: >60 mL/min (ref >60)
Glucose, Bld: 100 mg/dL — ABNORMAL HIGH (ref 70–99)
Potassium: 4.8 mmol/L (ref 3.5–5.1)
Sodium: 142 mmol/L (ref 135–145)
Total Bilirubin: 0.4 mg/dL (ref 0.3–1.2)
Total Protein: 7.3 g/dL (ref 6.5–8.1)

## 2018-09-09 LAB — CBC WITH DIFFERENTIAL (CANCER CENTER ONLY)
Abs Immature Granulocytes: 0.02 10*3/uL (ref 0.00–0.07)
Basophils Absolute: 0.1 10*3/uL (ref 0.0–0.1)
Basophils Relative: 1 %
Eosinophils Absolute: 0.2 10*3/uL (ref 0.0–0.5)
Eosinophils Relative: 2 %
HCT: 42.7 % (ref 36.0–46.0)
Hemoglobin: 13.9 g/dL (ref 12.0–15.0)
Immature Granulocytes: 0 %
Lymphocytes Relative: 16 %
Lymphs Abs: 1.3 10*3/uL (ref 0.7–4.0)
MCH: 30.5 pg (ref 26.0–34.0)
MCHC: 32.6 g/dL (ref 30.0–36.0)
MCV: 93.8 fL (ref 80.0–100.0)
Monocytes Absolute: 0.5 10*3/uL (ref 0.1–1.0)
Monocytes Relative: 6 %
Neutro Abs: 6 10*3/uL (ref 1.7–7.7)
Neutrophils Relative %: 75 %
Platelet Count: 231 10*3/uL (ref 150–400)
RBC: 4.55 MIL/uL (ref 3.87–5.11)
RDW: 12.6 % (ref 11.5–15.5)
WBC Count: 8.1 10*3/uL (ref 4.0–10.5)
nRBC: 0 % (ref 0.0–0.2)

## 2018-09-09 LAB — D-DIMER, QUANTITATIVE: D-Dimer, Quant: 0.85 ug/mL-FEU — ABNORMAL HIGH (ref 0.00–0.50)

## 2018-09-09 NOTE — Telephone Encounter (Signed)
Appts scheduled letter/calendar mailed per 5/19 los

## 2018-09-09 NOTE — Progress Notes (Signed)
Hematology and Oncology Follow Up Visit  Beverly Ingram [id number] August 01, 1965 53 y.o. 09/09/2018   Principle Diagnosis:  Left lower extremity DVT  Current Therapy:   Eliquis 5mg  PO BID   Interim History:  Beverly Ingram is here today for follow-up. She is still having a lot of pain in the left calf and groin. US last week showed chronic DVT of the femoral and popliteal vein. She has had persistent issues with post phlebitic syndrome. Thankfully, Dr. Loreta AveWagner with IR feels that this is something they can help her with and will be seeing her tomorrow for the first time.   She verbalized that she is taking her Eliquis BID ans prescribed and has had no issue with bleeding, bruising or petechiae.  No fever, chills, n/v, cough, rash, dizziness, SOB, chest pain, palpitations, abdominal pain or changes in bowel or bladder habits.  She has some mild swelling in the left foot. Pedal pulses are 2+.  No numbness or tingling in her extremities.  No lymphadenopathy noted on exam.  She has a good appetite and is staying well hydrated. Her weight is stable.  She is staying quite busy home schooling her daughter who loves it.   ECOG Performance Status: 1 - Symptomatic but completely ambulatory  Medications:  Allergies as of 09/09/2018      Reactions   No Known Allergies       Medication List       Accurate as of Sep 09, 2018  9:20 AM. If you have any questions, ask your nurse or doctor.        apixaban 5 MG Tabs tablet Commonly known as:  Eliquis Take 1 tablet (5 mg total) by mouth 2 (two) times daily.   cetirizine 10 MG chewable tablet Commonly known as:  ZYRTEC Chew 10 mg by mouth daily.   sertraline 100 MG tablet Commonly known as:  ZOLOFT Take 100 mg by mouth daily.   traMADol 50 MG tablet Commonly known as:  ULTRAM TAKE 1 TABLET (50 MG TOTAL) BY MOUTH EVERY 6 (SIX) HOURS AS NEEDED.   vitamin B-12 1000 MCG tablet Commonly known as:  CYANOCOBALAMIN Take 1,000 mcg by mouth daily.        Allergies:  Allergies  Allergen Reactions  . No Known Allergies     Past Medical History, Surgical history, Social history, and Family History were reviewed and updated.  Review of Systems: All other 10 point review of systems is negative.   Physical Exam:  vitals were not taken for this visit.   Wt Readings from Last 3 Encounters:  05/13/18 219 lb 12 oz (99.7 kg)  01/10/18 219 lb (99.3 kg)  09/06/17 219 lb (99.3 kg)    Ocular: Sclerae unicteric, pupils equal, round and reactive to light Ear-nose-throat: Oropharynx clear, dentition fair Lymphatic: No cervical or supraclavicular adenopathy Lungs no rales or rhonchi, good excursion bilaterally Heart regular rate and rhythm, no murmur appreciated Abd soft, nontender, positive bowel sounds, no liver or spleen tip palpated on exam, no fluid wave  MSK no focal spinal tenderness, no joint edema Neuro: non-focal, well-oriented, appropriate affect Breasts: Deferred   Lab Results  Component Value Date   WBC 7.3 05/13/2018   HGB 13.7 05/13/2018   HCT 41.8 05/13/2018   MCV 94.8 05/13/2018   PLT 231 05/13/2018   No results found for: FERRITIN, IRON, TIBC, UIBC, IRONPCTSAT Lab Results  Component Value Date   RBC 4.41 05/13/2018   No results found for: KPAFRELGTCHN, LAMBDASER, KAPLAMBRATIO No  results found for: Loel Lofty, IGMSERUM No results found for: Marda Stalker, SPEI   Chemistry      Component Value Date/Time   NA 141 05/13/2018 0853   NA 142 03/12/2017 0843   K 4.2 05/13/2018 0853   K 4.7 03/12/2017 0843   CL 104 05/13/2018 0853   CL 107 01/15/2017 0801   CO2 30 05/13/2018 0853   CO2 27 03/12/2017 0843   BUN 18 05/13/2018 0853   BUN 14.4 03/12/2017 0843   CREATININE 0.94 05/13/2018 0853   CREATININE 0.8 03/12/2017 0843      Component Value Date/Time   CALCIUM 9.7 05/13/2018 0853   CALCIUM 9.4 03/12/2017 0843   ALKPHOS 55 05/13/2018 0853   ALKPHOS 69  03/12/2017 0843   AST 22 05/13/2018 0853   AST 28 03/12/2017 0843   ALT 27 05/13/2018 0853   ALT 38 03/12/2017 0843   BILITOT 0.4 05/13/2018 0853   BILITOT 0.25 03/12/2017 0843       Impression and Plan: Beverly Ingram is a very pleasant 53 yo caucasian female withnonocclusive DVT of the left femoral and popliteal veins with persistent post phlebitic syndrome.  She sees DR. Loreta Ave tomorrow regarding the post phlebitic pain.  She will continue her same regimen with Eliquis.   We will go ahead and plan to see her in another 4 months.  She will contact our office with any questions or concerns. We can certainly see her sooner if need be.   Emeline Gins, NP 5/19/20209:20 AM

## 2018-09-10 ENCOUNTER — Other Ambulatory Visit: Payer: Self-pay | Admitting: *Deleted

## 2018-09-10 ENCOUNTER — Encounter: Payer: Self-pay | Admitting: *Deleted

## 2018-09-10 ENCOUNTER — Encounter: Payer: Self-pay | Admitting: Family

## 2018-09-10 ENCOUNTER — Ambulatory Visit
Admission: RE | Admit: 2018-09-10 | Discharge: 2018-09-10 | Disposition: A | Discharge status: Home / Self Care | Payer: BLUE CROSS/BLUE SHIELD | Source: Ambulatory Visit | Attending: Family | Admitting: Family

## 2018-09-10 DIAGNOSIS — I82432 Acute embolism and thrombosis of left popliteal vein: Secondary | ICD-10-CM

## 2018-09-10 DIAGNOSIS — I82512 Chronic embolism and thrombosis of left femoral vein: Secondary | ICD-10-CM

## 2018-09-10 DIAGNOSIS — R2242 Localized swelling, mass and lump, left lower limb: Secondary | ICD-10-CM | POA: Diagnosis not present

## 2018-09-10 DIAGNOSIS — I87009 Postthrombotic syndrome without complications of unspecified extremity: Secondary | ICD-10-CM

## 2018-09-10 DIAGNOSIS — M79605 Pain in left leg: Secondary | ICD-10-CM | POA: Diagnosis not present

## 2018-09-10 HISTORY — PX: IR RADIOLOGIST EVAL & MGMT: IMG5224

## 2018-09-10 MED ORDER — APIXABAN 5 MG PO TABS: 5.0000 mg | ORAL_TABLET | Freq: Two times a day (BID) | ORAL | 6 refills | Status: DC

## 2018-09-10 NOTE — Consult Note (Signed)
Chief Complaint: Left leg pain and swelling   Referring Physician(s): [place],Sarah M  History of Present Illness: Beverly Ingram is a 53 y.o. female presenting to VIR clinic today as a scheduled appointment, kindly referred by Beverly Emeline Gins, for evaluation of chronic left leg pain and swelling, and possible candidacy for venous treatment.    Beverly Ingram is here today by herself for the interview.  She is excellent historian and tells me that her original left leg DVT occurred 3 years ago within 6 weeks of a right knee surgery.  She had no prior history of DVT, with no diagnosis of PE or of right DVT.  She has no knowledge of any family members with DVT, and has no known thrombophilia.   At the time of the DVT, she initiated anti-coagulation, and compression therapy, and has been on anti-coagulation since then.    She did experience initial relief of the acute symptoms.  However, since then she has recognized increasing symptoms of her left leg.  These are mostly moderate pain (4-6/10 intensity) at the inguinal region and calf, which are very intrusive to her lifestyle, with decreased quality of life.  She recognizes that she has decreased activity level given the left leg symptoms, specifically the ability and desire to play badminton.    Her symptoms include: pain, cramps of the calf (described as "charley horse"), severe leg heaviness, with skin darkening/discoloration with associated swelling.  She recognizes that her left leg is "larger" than the right.    Villalta Score is 15, severe range.   She has a series of lower extremity duplex exams for her worsening symptoms, all of which have been negative for a recurrent DVT.  Her most recent was performed 09/02/2018.    Past Medical History:  Diagnosis Date   Anemia    Anxiety    Depression    DVT (deep venous thrombosis) (HCC)     Past Surgical History:  Procedure Laterality Date   ABDOMINAL HYSTERECTOMY     IR  RADIOLOGIST EVAL & MGMT  09/10/2018   KNEE ARTHROSCOPY Left 12/21/2015   Procedure: ARTHROSCOPY LEFT KNEE;  Surgeon: Beverely Low, MD;  Location: [facility] OR;  Service: Orthopedics;  Laterality: Left;   KNEE SURGERY Left 06/14/2016   Partial knee replacement     Allergies: No known allergies  Medications: Prior to Admission medications   Medication Sig Start Date End Date Taking? Authorizing Provider  apixaban (ELIQUIS) 5 MG TABS tablet Take 1 tablet (5 mg total) by mouth 2 (two) times daily. 09/10/18   [place], Brand Males, NP  buPROPion (WELLBUTRIN XL) 150 MG 24 hr tablet TAKE ONE TABLET (150 MG DOSE) BY MOUTH EVERY MORNING. 08/16/18   [provider]  cetirizine (ZYRTEC) 10 MG chewable tablet Chew 10 mg by mouth daily.    [provider]  rOPINIRole (REQUIP) 0.25 MG tablet Take by mouth. 08/14/18   [provider]  sertraline (ZOLOFT) 50 MG tablet TAKE ONE TABLET (50 MG DOSE) BY MOUTH DAILY. 08/16/18   [provider]  traMADol (ULTRAM) 50 MG tablet TAKE 1 TABLET (50 MG TOTAL) BY MOUTH EVERY 6 (SIX) HOURS AS NEEDED. 08/21/18   [place], Brand Males, NP  vitamin B-12 (CYANOCOBALAMIN) 1000 MCG tablet Take 1,000 mcg by mouth daily.    [provider]     No family history on file.  Social History   Socioeconomic History   Marital status: Married    Spouse name: Not on file  Number of children: Not on file   Years of education: Not on file   Highest education level: Not on file  Occupational History   Not on file  Social Needs   Financial resource strain: Not on file   Food insecurity:    Worry: Not on file    Inability: Not on file   Transportation needs:    Medical: Not on file    Non-medical: Not on file  Tobacco Use   Smoking status: Never Smoker   Smokeless tobacco: Never Used  Substance and Sexual Activity   Alcohol use: Yes    Comment: occ   Drug use: No   Sexual activity: Not on file  Lifestyle   Physical  activity:    Days per week: Not on file    Minutes per session: Not on file   Stress: Not on file  Relationships   Social connections:    Talks on phone: Not on file    Gets together: Not on file    Attends religious service: Not on file    Active member of club or organization: Not on file    Attends meetings of clubs or organizations: Not on file    Relationship status: Not on file  Other Topics Concern   Not on file  Social History Narrative   Not on file      Review of Systems: A 12 point ROS discussed and pertinent positives are indicated in the HPI above.  All other systems are negative.  Review of Systems  Vital Signs: BP 140/78    Pulse 72    Temp 98 F (36.7 C)    SpO2 100%   Physical Exam General: 53 yo female appearing   stated age.  Well-developed, well-nourished.  No distress. HEENT: Atraumatic, normocephalic.  Conjugate gaze, extra-ocular motor intact. No scleral icterus or scleral injection. No lesions on external ears, nose, lips, or gums.  Oral mucosa moist, pink.  Neck: Symmetric with no goiter enlargement.  Chest/Lungs:  Symmetric chest with inspiration/expiration.  No labored breathing.  Clear to auscultation with no wheezes, rhonchi, or rales.  Heart:  RRR, with no third heart sounds appreciated. No JVD appreciated.  Abdomen:  Soft, NT/ND, with + bowel sounds.   Genito-urinary: Deferred Neurologic: Alert & Oriented to person, place, and time.   Normal affect and insight.  Appropriate questions.  Moving all 4 extremities with gross sensory intact.  Extremities:  Asymmetric left leg, larger than the right.  This was confirmed by circumferential measurement of the thigh, calf, and ankle.  Thigh is 4cm greater on left, calf is 5cm greater on the left, and the ankle is 3cm greater on the left.  She has induration of the left leg, with rubor and swelling.  No asymmetric warmth.  TTP of the left calf on exam. No pitting edema.  No wound.   Imaging: [place] Venous  Img Lower Unilateral Left  Result Date: 09/02/2018 CLINICAL DATA:  53 year old female with lower extremity DVT EXAM: LEFT LOWER EXTREMITY VENOUS DOPPLER ULTRASOUND TECHNIQUE: Gray-scale sonography with graded compression, as well as color Doppler and duplex ultrasound were performed to evaluate the lower extremity deep venous systems from the level of the common femoral vein and including the common femoral, femoral, profunda femoral, popliteal and calf veins including the posterior tibial, peroneal and gastrocnemius veins when visible. The superficial great saphenous vein was also interrogated. Spectral Doppler was utilized to evaluate flow at rest and with distal augmentation maneuvers in the  common femoral, femoral and popliteal veins. COMPARISON:  Multiple prior most recent 01/14/2018 FINDINGS: Contralateral Common Femoral Vein: Respiratory phasicity is normal and symmetric with the symptomatic side. No evidence of thrombus. Normal compressibility. Common Femoral Vein: Compressible vein with flow maintained. No evidence of thrombus. Monophasic waveform with lack of respiratory phasicity. Saphenofemoral Junction: No evidence of thrombus. Normal compressibility and flow on color Doppler imaging. Profunda Femoral Vein: No evidence of thrombus. Normal compressibility and flow on color Doppler imaging. Femoral Vein: Patency of the femoral vein maintained, incomplete compressibility with flow maintained. Popliteal Vein: Patency of the popliteal vein maintained. Incomplete compressibility with maintained flow. Calf Veins: No evidence of thrombus. Normal compressibility and flow on color Doppler imaging. Superficial Great Saphenous Vein: No evidence of thrombus. Normal compressibility and flow on color Doppler imaging. Other Findings: Complex lentiform collection and the left knee measuring 6.4 cm x 1.8 cm by 4.2 cm. IMPRESSION: Sonographic survey negative for acute DVT. Monophasic waveform of the common femoral vein  indicating compromise of the more proximal iliac venous system. 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 These results were discussed by telephone at the time of interpretation on 09/02/2018 at 2:28 pm with Dr. Emeline Gins Signed, Yvone Neu. Reyne Dumas, RPVI [facility] Electronically Signed   By: Gilmer Mor D.O.   On: 09/02/2018 14:33   Ir Radiologist Eval & Mgmt  Result Date: 09/10/2018 Please refer to notes tab for details about interventional procedure. (Op Note)   Labs:  CBC: Recent Labs    01/10/18 0950 05/13/18 0853 09/09/18 0908  WBC 8.7 7.3 8.1  HGB 13.8 13.7 13.9  HCT 42.4 41.8 42.7  PLT 216 231 231    COAGS: No results for input(s): INR, APTT in the last 8760 hours.  BMP: Recent Labs    01/10/18 0950 05/13/18 0853 09/09/18 0908  NA 143 141 142  K 4.9 4.2 4.8  CL 104 104 104  CO2 GLUCOSE 105* 95 100*  BUN CALCIUM 10.0 9.7 9.7  CREATININE 0.93 0.94 0.95  GFRNONAA >60 >60 >60  GFRAA >60 >60 >60    LIVER FUNCTION TESTS: Recent Labs    01/10/18 0950 05/13/18 0853 09/09/18 0908  BILITOT 0.3 0.4 0.4  AST 35 22 18  ALT 44 27 22  ALKPHOS 79 55 61  PROT 7.7 7.0 7.3  ALBUMIN 4.4 4.8 4.7    TUMOR MARKERS: No results for input(s): AFPTM, CEA, CA199, CHROMGRNA in the last 8760 hours.  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    Her history is of a left leg provoked DVT after knee surgery, currently remaining on anti-coagulation.    I had a very extensive discussion with Beverly Ingram about her symptoms, the anatomy/pathology/pathophysiology, and treatment options for chronic DVT and post-thrombotic syndrome.   I shared with her my impression, which is a high suspicion for un-diagnosed May-Thurner syndrome, or potentially long segment left iliac vein  chronic stenosis (Rokitanski lesion), in the setting of chronic DVT.  I also shared my impression that she likely has chronic DVT changes of the left femoral-popliteal venous system.    Diagnostics for May-Thurner and venous stenosis may be CT or MR, however, there is low specificity, and CT/MR is typically not useful for lower extremity venous disease.    I did advise her that  the most sensitive/specific test would be lower extremity venogram with/without IVUS, which would typically be performed at the same session as the intended treatment -- balloon angioplasty and possible stenting.    I had a discussion about the logistics of angiogram and PTA/possible stenting, with specific risks to include: bleeding, infection, local injury, need for hospitalization, need for further procedure/surgery, DVT/PE, cardiopulmonary collapse, death.   I did reinforce also the indication/need for thigh-high compression stockings given her symptoms.  Life-long therapy is indicated.   After our discussion she would like to discuss the details with her family and consider her options.   Plan: - If she would like to proceed with left lower extremity venogram and possible intervention, she will let us know.  This would, at her convenience, be performed with Dr. Loreta Ave at [facility] as outpatient same-day.  Intra-vascular US would also be employed for diagnostics.  - When we proceed with treatment, she will need to hold her eliquis leading up to the procedure, with possible lovenox bridging by her Heme/Onc Team.   - If she prefers to undergo CT imaging before venogram for planning, that would be reasonable (CTA abdomen/pelvis, venous protocol for DVT) -  I have advised her to continue wearing compression stockings on the left, thigh high.   Thank you for this interesting consult.  I greatly enjoyed meeting Jaleesha Buckel and look forward to participating in their care.  A copy of this report was sent to the requesting provider on  this date.  Electronically Signed: Gilmer Mor 09/10/2018, 3:39 PM   I spent a total of  40 Minutes   in face to face in clinical consultation, greater than 50% of which was counseling/coordinating care for post-thrombotic syndrome of the left leg, history of DVT, possible May-Thurner syndrome, with possible venogram and intervention.

## 2018-09-15 ENCOUNTER — Encounter: Payer: Self-pay | Admitting: Family

## 2018-09-16 DIAGNOSIS — G2581 Restless legs syndrome: Secondary | ICD-10-CM | POA: Diagnosis not present

## 2018-09-16 DIAGNOSIS — F411 Generalized anxiety disorder: Secondary | ICD-10-CM | POA: Diagnosis not present

## 2018-10-03 ENCOUNTER — Other Ambulatory Visit (HOSPITAL_COMMUNITY): Payer: Self-pay | Admitting: Interventional Radiology

## 2018-10-03 DIAGNOSIS — I871 Compression of vein: Secondary | ICD-10-CM

## 2018-10-14 ENCOUNTER — Other Ambulatory Visit: Payer: Self-pay | Admitting: Radiology

## 2018-10-14 ENCOUNTER — Other Ambulatory Visit: Payer: Self-pay | Admitting: Physician Assistant

## 2018-10-15 ENCOUNTER — Other Ambulatory Visit (HOSPITAL_COMMUNITY): Payer: Self-pay | Admitting: Interventional Radiology

## 2018-10-15 ENCOUNTER — Encounter (HOSPITAL_COMMUNITY): Payer: Self-pay

## 2018-10-15 ENCOUNTER — Ambulatory Visit (HOSPITAL_COMMUNITY)
Admission: RE | Admit: 2018-10-15 | Discharge: 2018-10-15 | Disposition: A | Discharge status: Home / Self Care | Payer: BC Managed Care – PPO | Source: Ambulatory Visit | Attending: Interventional Radiology | Admitting: Interventional Radiology

## 2018-10-15 ENCOUNTER — Other Ambulatory Visit: Payer: Self-pay

## 2018-10-15 DIAGNOSIS — M79605 Pain in left leg: Secondary | ICD-10-CM | POA: Diagnosis not present

## 2018-10-15 DIAGNOSIS — Z86718 Personal history of other venous thrombosis and embolism: Secondary | ICD-10-CM | POA: Insufficient documentation

## 2018-10-15 DIAGNOSIS — Z7901 Long term (current) use of anticoagulants: Secondary | ICD-10-CM | POA: Diagnosis not present

## 2018-10-15 DIAGNOSIS — Z79899 Other long term (current) drug therapy: Secondary | ICD-10-CM | POA: Insufficient documentation

## 2018-10-15 DIAGNOSIS — I871 Compression of vein: Secondary | ICD-10-CM | POA: Diagnosis not present

## 2018-10-15 DIAGNOSIS — F329 Major depressive disorder, single episode, unspecified: Secondary | ICD-10-CM | POA: Insufficient documentation

## 2018-10-15 DIAGNOSIS — F419 Anxiety disorder, unspecified: Secondary | ICD-10-CM | POA: Insufficient documentation

## 2018-10-15 DIAGNOSIS — I82422 Acute embolism and thrombosis of left iliac vein: Secondary | ICD-10-CM | POA: Diagnosis not present

## 2018-10-15 DIAGNOSIS — M7989 Other specified soft tissue disorders: Secondary | ICD-10-CM | POA: Insufficient documentation

## 2018-10-15 DIAGNOSIS — I82522 Chronic embolism and thrombosis of left iliac vein: Secondary | ICD-10-CM | POA: Diagnosis not present

## 2018-10-15 HISTORY — PX: IR INTRAVASCULAR ULTRASOUND NON CORONARY: IMG6085

## 2018-10-15 HISTORY — PX: IR IVUS EACH ADDITIONAL NON CORONARY VESSEL: IMG6086

## 2018-10-15 HISTORY — PX: IR TRANSCATH PLC STENT 1ST ART NOT LE CV CAR VERT CAR: IMG5443

## 2018-10-15 HISTORY — PX: IR VENOCAVAGRAM IVC: IMG678

## 2018-10-15 HISTORY — PX: IR US GUIDE VASC ACCESS LEFT: IMG2389

## 2018-10-15 HISTORY — PX: IR VENO/EXT/BI: IMG677

## 2018-10-15 LAB — PROTIME-INR
INR: 1.1 (ref 0.8–1.2)
Prothrombin Time: 14.2 s (ref 11.4–15.2)

## 2018-10-15 LAB — CBC
HCT: 39.5 % (ref 36.0–46.0)
Hemoglobin: 12.4 g/dL (ref 12.0–15.0)
MCH: 29.8 pg (ref 26.0–34.0)
MCHC: 31.4 g/dL (ref 30.0–36.0)
MCV: 95 fL (ref 80.0–100.0)
Platelets: 199 10*3/uL (ref 150–400)
RBC: 4.16 MIL/uL (ref 3.87–5.11)
RDW: 12.8 % (ref 11.5–15.5)
WBC: 7.6 10*3/uL (ref 4.0–10.5)
nRBC: 0 % (ref 0.0–0.2)

## 2018-10-15 LAB — BASIC METABOLIC PANEL WITH GFR
Anion gap: 8 (ref 5–15)
BUN: 12 mg/dL (ref 6–20)
CO2: 27 mmol/L (ref 22–32)
Calcium: 8.8 mg/dL — ABNORMAL LOW (ref 8.9–10.3)
Chloride: 107 mmol/L (ref 98–111)
Creatinine, Ser: 0.96 mg/dL (ref 0.44–1.00)
GFR calc Af Amer: >60 mL/min
GFR calc non Af Amer: >60 mL/min
Glucose, Bld: 105 mg/dL — ABNORMAL HIGH (ref 70–99)
Potassium: 4.4 mmol/L (ref 3.5–5.1)
Sodium: 142 mmol/L (ref 135–145)

## 2018-10-15 LAB — APTT: aPTT: 30 s (ref 24–36)

## 2018-10-15 MED ORDER — LIDOCAINE HCL 1 % IJ SOLN
INTRAMUSCULAR | Status: AC
  Filled 2018-10-15: qty 20

## 2018-10-15 MED ORDER — ENOXAPARIN SODIUM 150 MG/ML ~~LOC~~ SOLN: 150.0000 mg | SUBCUTANEOUS | 0 refills | Status: DC

## 2018-10-15 MED ORDER — SODIUM CHLORIDE 0.9 % IV SOLN
INTRAVENOUS | Status: DC
  Administered 2018-10-15: 08:00:00 via INTRAVENOUS

## 2018-10-15 MED ORDER — MIDAZOLAM HCL 2 MG/2ML IJ SOLN
INTRAMUSCULAR | Status: AC | PRN
  Administered 2018-10-15 (×2): 0.5 mg via INTRAVENOUS
  Administered 2018-10-15 (×2): 1 mg via INTRAVENOUS

## 2018-10-15 MED ORDER — HEPARIN SODIUM (PORCINE) 1000 UNIT/ML IJ SOLN
INTRAMUSCULAR | Status: AC | PRN
  Administered 2018-10-15: 8000 [IU] via INTRAVENOUS

## 2018-10-15 MED ORDER — MIDAZOLAM HCL 2 MG/2ML IJ SOLN
INTRAMUSCULAR | Status: AC
  Filled 2018-10-15: qty 4

## 2018-10-15 MED ORDER — FENTANYL CITRATE (PF) 100 MCG/2ML IJ SOLN
INTRAMUSCULAR | Status: AC
  Filled 2018-10-15: qty 4

## 2018-10-15 MED ORDER — IOHEXOL 300 MG/ML  SOLN
150.0000 mL | Freq: Once | INTRAMUSCULAR | Status: AC | PRN
  Administered 2018-10-15: 100 mL via INTRAVENOUS

## 2018-10-15 MED ORDER — KETOROLAC TROMETHAMINE 30 MG/ML IJ SOLN
INTRAMUSCULAR | Status: AC | PRN
  Administered 2018-10-15: 30 mg via INTRAVENOUS

## 2018-10-15 MED ORDER — HEPARIN SODIUM (PORCINE) 1000 UNIT/ML IJ SOLN
INTRAMUSCULAR | Status: AC
  Filled 2018-10-15: qty 1

## 2018-10-15 MED ORDER — FENTANYL CITRATE (PF) 100 MCG/2ML IJ SOLN
INTRAMUSCULAR | Status: AC | PRN
  Administered 2018-10-15 (×2): 50 ug via INTRAVENOUS
  Administered 2018-10-15 (×2): 25 ug via INTRAVENOUS

## 2018-10-15 MED ORDER — ASPIRIN 325 MG PO TABS: 325.0000 mg | ORAL_TABLET | Freq: Once | ORAL | Status: DC

## 2018-10-15 MED ORDER — KETOROLAC TROMETHAMINE 30 MG/ML IJ SOLN
INTRAMUSCULAR | Status: AC
  Filled 2018-10-15: qty 1

## 2018-10-15 NOTE — Procedures (Signed)
Interventional Radiology Procedure Note  Procedure: US guided left GSV access for left extremity and IVC venogram.  IVUS of IVC, CIV, EIV.   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  Plan for 30 days of weight based lovenox, then to previous oral AC - start 81mg  ASA daily.  At least 6 months for stent endothelialization - Do not submerge for 7 days - Routine wound care  Signed,  Dulcy Fanny. Earleen Newport, DO

## 2018-10-15 NOTE — Discharge Instructions (Signed)
Enoxaparin injection What is this medicine? ENOXAPARIN (ee nox a PA rin) is used after knee, hip, or abdominal surgeries to prevent blood clotting. It is also used to treat existing blood clots in the lungs or in the veins. This medicine may be used for other purposes; ask your health care provider or pharmacist if you have questions. COMMON BRAND NAME(S): Lovenox What should I tell my health care provider before I take this medicine? They need to know if you have any of these conditions: -bleeding disorders, hemorrhage, or hemophilia -infection of the heart or heart valves -kidney or liver disease -previous stroke -prosthetic heart valve -recent surgery or delivery of a baby -ulcer in the stomach or intestine, diverticulitis, or other bowel disease -an unusual or allergic reaction to enoxaparin, heparin, pork or pork products, other medicines, foods, dyes, or preservatives -pregnant or trying to get pregnant -breast-feeding How should I use this medicine? This medicine is for injection under the skin. It is usually given by a health-care professional. You or a family member may be trained on how to give the injections. If you are to give yourself injections, make sure you understand how to use the syringe, measure the dose if necessary, and give the injection. To avoid bruising, do not rub the site where this medicine has been injected. Do not take your medicine more often than directed. Do not stop taking except on the advice of your doctor or health care professional. Make sure you receive a puncture-resistant container to dispose of the needles and syringes once you have finished with them. Do not reuse these items. Return the container to your doctor or health care professional for proper disposal. Talk to your pediatrician regarding the use of this medicine in children. Special care may be needed. Overdosage: If you think you have taken too much of this medicine contact a poison control  center or emergency room at once. NOTE: This medicine is only for you. Do not share this medicine with others. What if I miss a dose? If you miss a dose, take it as soon as you can. If it is almost time for your next dose, take only that dose. Do not take double or extra doses. What may interact with this medicine? -aspirin and aspirin-like medicines -certain medicines that treat or prevent blood clots -dipyridamole -NSAIDs, medicines for pain and inflammation, like ibuprofen or naproxen This list may not describe all possible interactions. Give your health care provider a list of all the medicines, herbs, non-prescription drugs, or dietary supplements you use. Also tell them if you smoke, drink alcohol, or use illegal drugs. Some items may interact with your medicine. What should I watch for while using this medicine? Visit your healthcare professional for regular checks on your progress. You may need blood work done while you are taking this medicine. Your condition will be monitored carefully while you are receiving this medicine. It is important not to miss any appointments. If you are going to need surgery or other procedure, tell your healthcare professional that you are using this medicine. Using this medicine for a long time may weaken your bones and increase the risk of bone fractures. Avoid sports and activities that might cause injury while you are using this medicine. Severe falls or injuries can cause unseen bleeding. Be careful when using sharp tools or knives. Consider using an Copy. Take special care brushing or flossing your teeth. Report any injuries, bruising, or red spots on the skin to your healthcare  professional. Wear a medical ID bracelet or chain. Carry a card that describes your disease and details of your medicine and dosage times. What side effects may I notice from receiving this medicine? Side effects that you should report to your doctor or health care  professional as soon as possible: -allergic reactions like skin rash, itching or hives, swelling of the face, lips, or tongue -bone pain -signs and symptoms of bleeding such as bloody or black, tarry stools; red or dark-brown urine; spitting up blood or brown material that looks like coffee grounds; red spots on the skin; unusual bruising or bleeding from the eye, gums, or nose -signs and symptoms of a blood clot such as chest pain; shortness of breath; pain, swelling, or warmth in the leg -signs and symptoms of a stroke such as changes in vision; confusion; trouble speaking or understanding; severe headaches; sudden numbness or weakness of the face, arm or leg; trouble walking; dizziness; loss of coordination Side effects that usually do not require medical attention (report to your doctor or health care professional if they continue or are bothersome): -hair loss -pain, redness, or irritation at site where injected This list may not describe all possible side effects. Call your doctor for medical advice about side effects. You may report side effects to FDA at 1-800-FDA-1088. Where should I keep my medicine? Keep out of the reach of children. Store at room temperature between 15 and 30 degrees C (59 and 86 degrees F). Do not freeze. If your injections have been specially prepared, you may need to store them in the refrigerator. Ask your pharmacist. Throw away any unused medicine after the expiration date. NOTE: This sheet is a summary. It may not cover all possible information. If you have questions about this medicine, talk to your doctor, pharmacist, or health care provider.  2019 Elsevier/Gold Standard (2017-04-04 11:25:34) Venous  Site Care This sheet gives you information about how to care for yourself after your procedure. Your health care provider may also give you more specific instructions. If you have problems or questions, contact your health care provider. What can I expect after the  procedure? After the procedure, it is common to have:  Bruising that usually fades within 1-2 weeks.  Tenderness at the site. Follow these instructions at home: Wound care  Follow instructions from your health care provider about how to take care of your insertion site. Make sure you: ? Wash your hands with soap and water before you change your bandage (dressing). If soap and water are not available, use hand sanitizer. ? Change your dressing as told by your health care provider. ? Leave stitches (sutures), skin glue, or adhesive strips in place. These skin closures may need to stay in place for 2 weeks or longer. If adhesive strip edges start to loosen and curl up, you may trim the loose edges. Do not remove adhesive strips completely unless your health care provider tells you to do that.  Do not take baths, swim, or use a hot tub until your health care provider approves.  You may shower 24-48 hours after the procedure or as told by your health care provider. ? Gently wash the site with plain soap and water. ? Pat the area dry with a clean towel. ? Do not rub the site. This may cause bleeding.  Do not apply powder or lotion to the site. Keep the site clean and dry.  Check your femoral site every day for signs of infection. Check for: ?  Redness, swelling, or pain. ? Fluid or blood. ? Warmth. ? Pus or a bad smell. Activity  For the first 2-3 days after your procedure, or as long as directed: ? Avoid climbing stairs as much as possible. ? Do not squat.  Do not lift anything that is heavier than 10 lb (4.5 kg), or the limit that you are told, until your health care provider says that it is safe.  Rest as directed. ? Avoid sitting for a long time without moving. Get up to take short walks every 1-2 hours.  Do not drive for 24 hours if you were given a medicine to help you relax (sedative). General instructions  Take over-the-counter and prescription medicines only as told by  your health care provider.  Keep all follow-up visits as told by your health care provider. This is important. Contact a health care provider if you have:  A fever or chills.  You have redness, swelling, or pain around your insertion site. Get help right away if:  The catheter insertion area swells very fast.  You pass out.  You suddenly start to sweat or your skin gets clammy.  The catheter insertion area is bleeding, and the bleeding does not stop when you hold steady pressure on the area.  The area near or just beyond the catheter insertion site becomes pale, cool, tingly, or numb. These symptoms may represent a serious problem that is an emergency. Do not wait to see if the symptoms will go away. Get medical help right away. Call your local emergency services (911 in the [place] Do not drive yourself to the hospital. Summary  After the procedure, it is common to have bruising that usually fades within 1-2 weeks.  Check your femoral site every day for signs of infection.  Do not lift anything that is heavier than 10 lb (4.5 kg), or the limit that you are told, until your health care provider says that it is safe. This information is not intended to replace advice given to you by your health care provider. Make sure you discuss any questions you have with your health care provider. Document Released: 12/11/2013 Document Revised: 04/22/2017 Document Reviewed: 04/22/2017 Elsevier Interactive Patient Education  2019 Elsevier Inc.   Moderate Conscious Sedation, Adult, Care After These instructions provide you with information about caring for yourself after your procedure. Your health care provider may also give you more specific instructions. Your treatment has been planned according to current medical practices, but problems sometimes occur. Call your health care provider if you have any problems or questions after your procedure. What can I expect after the procedure? After your  procedure, it is common:  To feel sleepy for several hours.  To feel clumsy and have poor balance for several hours.  To have poor judgment for several hours.  To vomit if you eat too soon. Follow these instructions at home: For at least 24 hours after the procedure:   Do not: ? Participate in activities where you could fall or become injured. ? Drive. ? Use heavy machinery. ? Drink alcohol. ? Take sleeping pills or medicines that cause drowsiness. ? Make important decisions or sign legal documents. ? Take care of children on your own.  Rest. Eating and drinking  Follow the diet recommended by your health care provider.  If you vomit: ? Drink water, juice, or soup when you can drink without vomiting. ? Make sure you have little or no nausea before eating solid foods. General instructions  Have a responsible adult stay with you until you are awake and alert.  Take over-the-counter and prescription medicines only as told by your health care provider.  If you smoke, do not smoke without supervision.  Keep all follow-up visits as told by your health care provider. This is important. Contact a health care provider if:  You keep feeling nauseous or you keep vomiting.  You feel light-headed.  You develop a rash.  You have a fever. Get help right away if:  You have trouble breathing. This information is not intended to replace advice given to you by your health care provider. Make sure you discuss any questions you have with your health care provider. Document Released: 01/28/2013 Document Revised: 09/12/2015 Document Reviewed: 07/30/2015 Elsevier Interactive Patient Education  2019 [employer].

## 2018-10-15 NOTE — Progress Notes (Signed)
No bleeding or swelling noted in groin after ambulation

## 2018-10-15 NOTE — Progress Notes (Signed)
D/c instructions reviewed with husband Beverly Ingram and patient.  All questions answered and patient and Beverly Ingram verbalized understanding

## 2018-10-15 NOTE — Progress Notes (Signed)
lovenox education completed with patient

## 2018-10-15 NOTE — Sedation Documentation (Signed)
No antibiotics for this case, per Dr Earleen Newport

## 2018-10-15 NOTE — H&P (Signed)
Chief Complaint: Patient was seen in consultation today for inferior vena cavagram with left lower extremity venogram and possible intervention at request of Emeline GinsSarah [place] NP  Referring Physician(s): Gilmer MorWagner,Jaime  Supervising Physician: Gilmer MorWagner, Jaime  [facility]  History of Present Illness: Beverly Ingram is a 53 y.o. female   Was seen in consultation with Dr Loreta AveWagner 09/10/18 For chronic left leg pain and swelling since knee replacement surgery 3 yrs ago Post op DVT-- remains on Eliquis LD Eliquis 6/21 Left leg pain has worsened and changed to include groin area for several months  Doppler 09/02/18:  IMPRESSION: Sonographic survey negative for acute DVT. Monophasic waveform of the common femoral vein indicating compromise of the more proximal iliac venous system. 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  Plan: - She would like to proceed with left lower extremity venogram and possible intervention.  This would be [facility] with [facility].  Intra-vascular US would also be employed for diagnostics.  - When we proceed with treatment, she will need to hold her eliquis leading up to the procedure, with possible lovenox bridging by her Heme/Onc Team.   -  I have advised her to continue wearing compression stockings on the left, thigh high  Scheduled today for procedure  Past Medical History:  Diagnosis Date  . Anemia   . Anxiety   . Depression   . DVT (deep venous thrombosis) (HCC)     Past Surgical History:  Procedure Laterality Date  . ABDOMINAL HYSTERECTOMY    . IR RADIOLOGIST EVAL & MGMT  09/10/2018  . KNEE ARTHROSCOPY Left 12/21/2015   Procedure: ARTHROSCOPY LEFT KNEE;  Surgeon: Beverely LowSteve Norris, MD;  [facility];  Service: Orthopedics;  Laterality: Left;  . KNEE SURGERY Left 06/14/2016   Partial knee replacement     Allergies: Patient has no known allergies.  Medications: Prior to  Admission medications   Medication Sig Start Date End Date Taking? Authorizing Provider  buPROPion (WELLBUTRIN XL) 150 MG 24 hr tablet Take 150 mg by mouth daily.  08/16/18  Yes [provider]  cetirizine (ZYRTEC) 10 MG tablet Take 10 mg by mouth daily.    Yes [provider]  rOPINIRole (REQUIP) 0.25 MG tablet Take 0.25 mg by mouth at bedtime as needed (restless legs).  08/14/18  Yes [provider]  sertraline (ZOLOFT) 50 MG tablet Take 50 mg by mouth daily.  08/16/18  Yes [provider]  traMADol (ULTRAM) 50 MG tablet TAKE 1 TABLET (50 MG TOTAL) BY MOUTH EVERY 6 (SIX) HOURS AS NEEDED. 08/21/18  Yes [place], Brand MalesSarah M, NP  vitamin B-12 (CYANOCOBALAMIN) 1000 MCG tablet Take 1,000 mcg by mouth daily.   Yes [provider]  apixaban (ELIQUIS) 5 MG TABS tablet Take 1 tablet (5 mg total) by mouth 2 (two) times daily. 09/10/18   [place], Brand MalesSarah M, NP     History reviewed. No pertinent family history.  Social History   Socioeconomic History  . Marital status: Married    Spouse name: Not on file  . Number of children: Not on file  . Years of education: Not on file  . Highest education level: Not on file  Occupational History  . Not on file  Social Needs  . Financial resource strain: Not on file  . Food insecurity    Worry: Not on file    Inability: Not on file  . Transportation needs    Medical:  Not on file    Non-medical: Not on file  Tobacco Use  . Smoking status: Never Smoker  . Smokeless tobacco: Never Used  Substance and Sexual Activity  . Alcohol use: Yes    Comment: occ  . Drug use: No  . Sexual activity: Not on file  Lifestyle  . Physical activity    Days per week: Not on file    Minutes per session: Not on file  . Stress: Not on file  Relationships  . Social Musicianconnections    Talks on phone: Not on file    Gets together: Not on file    Attends religious service: Not on file    Active member of club or organization: Not on  file    Attends meetings of clubs or organizations: Not on file    Relationship status: Not on file  Other Topics Concern  . Not on file  Social History Narrative  . Not on file    Review of Systems: A 12 point ROS discussed and pertinent positives are indicated in the HPI above.  All other systems are negative.  Review of Systems  Constitutional: Negative for activity change.  Respiratory: Negative for cough and shortness of breath.   Cardiovascular: Positive for leg swelling. Negative for chest pain.  Gastrointestinal: Negative for abdominal pain.  Musculoskeletal: Negative for back pain and gait problem.  Neurological: Negative for weakness.  Psychiatric/Behavioral: Negative for behavioral problems and confusion.    Vital Signs: BP 112/64   Pulse 66   Temp 98.2 F (36.8 C) (Oral)   Ht 5\' 7"  (1.702 m)   Wt 225 lb (102.1 kg)   SpO2 100%   BMI 35.24 kg/m   Physical Exam Vitals signs reviewed.  Cardiovascular:     Rate and Rhythm: Normal rate and regular rhythm.     Heart sounds: Normal heart sounds.  Pulmonary:     Effort: Pulmonary effort is normal.     Breath sounds: Normal breath sounds.  Abdominal:     General: Bowel sounds are normal.     Tenderness: There is no abdominal tenderness.  Musculoskeletal: Normal range of motion.        General: Swelling present.     Comments: Left low leg with minimal swelling  Skin:    General: Skin is warm and dry.  Neurological:     Mental Status: She is alert and oriented to person, place, and time.  Psychiatric:        Mood and Affect: Mood normal.        Behavior: Behavior normal.        Thought Content: Thought content normal.        Judgment: Judgment normal.     Imaging: No results found.  Labs:  CBC: Recent Labs    01/10/18 0950 05/13/18 0853 09/09/18 0908  WBC 8.7 7.3 8.1  HGB 13.8 13.7 13.9  HCT 42.4 41.8 42.7  PLT 216 231 231    COAGS: No results for input(s): INR, APTT in the last 8760 hours.   BMP: Recent Labs    01/10/18 0950 05/13/18 0853 09/09/18 0908  NA 143 141 142  K 4.9 4.2 4.8  CL 104 104 104  CO2 30 30 31   GLUCOSE 105* 95 100*  BUN 15 18 14   CALCIUM 10.0 9.7 9.7  CREATININE 0.93 0.94 0.95  GFRNONAA >60 >60 >60  GFRAA >60 >60 >60    LIVER FUNCTION TESTS: Recent Labs    01/10/18 0950 05/13/18  0355 09/09/18 0908  BILITOT 0.3 0.4 0.4  AST 35 22 18  ALT 44 27 22  ALKPHOS 79 55 61  PROT 7.7 7.0 7.3  ALBUMIN 4.4 4.8 4.7    TUMOR MARKERS: No results for input(s): AFPTM, CEA, CA199, CHROMGRNA in the last 8760 hours.  Assessment and Plan:  Chronic left leg swelling and pain Doppler reveals Monophasic waveform of the common femoral vein indicating compromise of the more proximal iliac venous system. Consulted with Dr Earleen Newport-- now planned for Inferior vena cavagram and left low extremity venogram with possible intervention Pt is aware of procedure benefits and risks- including but not limited to: infection; bleeding; vessel damage Agreeable to proceed Consent signed    Thank you for this interesting consult.  I greatly enjoyed meeting Abir Eroh and look forward to participating in their care.  A copy of this report was sent to the requesting provider on this date.  Electronically Signed: Lavonia Drafts, PA-C 10/15/2018, 8:05 AM   I spent a total of  30 Minutes   in face to face in clinical consultation, greater than 50% of which was counseling/coordinating care for Inferior vena cavagram and left leg venogram with possible intervention

## 2018-10-16 ENCOUNTER — Other Ambulatory Visit: Payer: Self-pay | Admitting: Interventional Radiology

## 2018-10-16 ENCOUNTER — Other Ambulatory Visit (HOSPITAL_COMMUNITY): Payer: Self-pay | Admitting: Interventional Radiology

## 2018-10-16 ENCOUNTER — Encounter (HOSPITAL_COMMUNITY): Payer: Self-pay

## 2018-10-16 DIAGNOSIS — I871 Compression of vein: Secondary | ICD-10-CM

## 2018-10-16 DIAGNOSIS — I82402 Acute embolism and thrombosis of unspecified deep veins of left lower extremity: Secondary | ICD-10-CM

## 2018-11-10 ENCOUNTER — Encounter: Payer: Self-pay | Admitting: Family

## 2018-11-20 ENCOUNTER — Ambulatory Visit
Admission: RE | Admit: 2018-11-20 | Discharge: 2018-11-20 | Disposition: A | Discharge status: Home / Self Care | Payer: BC Managed Care – PPO | Source: Ambulatory Visit | Attending: Interventional Radiology | Admitting: Interventional Radiology

## 2018-11-20 ENCOUNTER — Encounter: Payer: Self-pay | Admitting: *Deleted

## 2018-11-20 DIAGNOSIS — Z86718 Personal history of other venous thrombosis and embolism: Secondary | ICD-10-CM | POA: Diagnosis not present

## 2018-11-20 DIAGNOSIS — I82402 Acute embolism and thrombosis of unspecified deep veins of left lower extremity: Secondary | ICD-10-CM

## 2018-11-20 DIAGNOSIS — Z9889 Other specified postprocedural states: Secondary | ICD-10-CM | POA: Diagnosis not present

## 2018-11-20 DIAGNOSIS — I87002 Postthrombotic syndrome without complications of left lower extremity: Secondary | ICD-10-CM | POA: Diagnosis not present

## 2018-11-20 HISTORY — PX: IR RADIOLOGIST EVAL & MGMT: IMG5224

## 2018-11-20 NOTE — Progress Notes (Signed)
Chief Complaint: Left leg swelling  Referring Physician(s): [place],Sara  History of Present Illness: Beverly Ingram is a 53 y.o. 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    Beverly Ingram is here today in our office for her follow up duplex and office visit.  She is here by herself.    On 10/15/2018 we did left lower extremity venogram, IVUS, and treatment of venous narrowing related to May-Thurner/Rokitanski lesion.  A dedicated venous stent was placed, 62mm x 96mm.   She tells me today that she is very satisfied, and "couldn't be more happy."  She has recovered completely from the procedure.  She reports occasional "catch" in her back when she turns over at night, but there is no persisting pain.    She denies symptoms of swelling, redness, induration, cramping, heaviness, itching, burning, varicose veins, numbness, tingling.  She tells me all of her prior symptoms have resolved.   She continues currently on eliquis.  She is still taking 81mg  ASA daily.   She tells me that she has compression stockings, but does not typically wear them.    Duplex today shows no DVT.  Subjective improvement in the phasic waveform at the left CFV.   Past Medical History:  Diagnosis Date  . Anemia   . Anxiety   . Depression   . DVT (deep venous thrombosis) ([place])     Past Surgical History:  Procedure Laterality Date  . ABDOMINAL HYSTERECTOMY    . IR INTRAVASCULAR ULTRASOUND NON CORONARY  10/15/2018  . IR IVUS EACH ADDITIONAL NON CORONARY VESSEL  10/15/2018  . IR IVUS EACH ADDITIONAL NON CORONARY VESSEL  10/15/2018  . IR IVUS EACH ADDITIONAL NON CORONARY VESSEL  10/15/2018  . IR RADIOLOGIST EVAL & MGMT  09/10/2018  . IR RADIOLOGIST EVAL & MGMT  11/20/2018  . IR TRANSCATH PLC STENT 1ST ART NOT LE CV CAR VERT [place]  10/15/2018  . IR US GUIDE VASC ACCESS LEFT  10/15/2018  .  IR VENO/EXT/BI  10/15/2018  . IR VENOCAVAGRAM IVC  10/15/2018  . KNEE ARTHROSCOPY Left 12/21/2015   Procedure: ARTHROSCOPY LEFT KNEE;  Surgeon: Netta Cedars, MD;  Location: [facility];  Service: Orthopedics;  Laterality: Left;  . KNEE SURGERY Left 06/14/2016   Partial knee replacement     Allergies: Patient has no known allergies.  Medications: Prior to Admission medications   Medication Sig Start Date End Date Taking? Authorizing Provider  apixaban (ELIQUIS) 5 MG TABS tablet Take 1 tablet (5 mg total) by mouth 2 (two) times daily. 09/10/18   [place], Holli Humbles, NP  buPROPion (WELLBUTRIN XL) 150 MG 24 hr tablet Take 150 mg by mouth daily.  08/16/18   [provider]  cetirizine (ZYRTEC) 10 MG tablet Take 10 mg by mouth daily.     [provider]  enoxaparin (LOVENOX) 150 MG/ML injection Inject 1 mL (150 mg total) into the skin daily for 30 days. 10/15/18 11/14/18  Monia Sabal, PA-C  rOPINIRole (REQUIP) 0.25 MG tablet Take 0.25 mg by mouth at bedtime as needed (restless legs).  08/14/18   [provider]  sertraline (ZOLOFT) 50 MG tablet Take 50 mg by mouth daily.  08/16/18   [provider]  traMADol (ULTRAM) 50 MG tablet TAKE 1 TABLET (50 MG TOTAL) BY MOUTH EVERY 6 (SIX) HOURS AS NEEDED. 08/21/18   [place], Holli Humbles, NP  vitamin  B-12 (CYANOCOBALAMIN) 1000 MCG tablet Take 1,000 mcg by mouth daily.    [provider]     No family history on file.  Social History   Socioeconomic History  . Marital status: Married    Spouse name: Not on file  . Number of children: Not on file  . Years of education: Not on file  . Highest education level: Not on file  Occupational History  . Not on file  Social Needs  . Financial resource strain: Not on file  . Food insecurity    Worry: Not on file    Inability: Not on file  . Transportation needs    Medical: Not on file    Non-medical: Not on file  Tobacco Use  . Smoking status: Never Smoker  . Smokeless  tobacco: Never Used  Substance and Sexual Activity  . Alcohol use: Yes    Comment: occ  . Drug use: No  . Sexual activity: Not on file  Lifestyle  . Physical activity    Days per week: Not on file    Minutes per session: Not on file  . Stress: Not on file  Relationships  . Social Musicianconnections    Talks on phone: Not on file    Gets together: Not on file    Attends religious service: Not on file    Active member of club or organization: Not on file    Attends meetings of clubs or organizations: Not on file    Relationship status: Not on file  Other Topics Concern  . Not on file  Social History Narrative  . Not on file       Review of Systems: A 12 point ROS discussed and pertinent positives are indicated in the HPI above.  All other systems are negative.  Review of Systems  Vital Signs: BP 127/70 (BP Location: Right Arm)   Pulse 76   Temp 97.7 F (36.5 C)   SpO2 99%   Physical Exam Targeted exam of the LLE shows no swelling, redness, varicose veins, induration, discoloration, warmth.  Circumferential measurements of the bilateral legs: Thigh: left is 0.5cm greater Calf: left is 1cm greater Angle: left is equal to right  Imaging: [place] Venous Img Lower Unilateral Left  Result Date: 11/20/2018 CLINICAL DATA:  53 year old female with a history prior DVT left lower extremity, post thrombotic syndrome, left lower extremity, treatment left May-Thurner and Rokitansky lesion 10/15/2018 with dedicated venous stenting of the left iliac system EXAM: LEFT LOWER EXTREMITY VENOUS DOPPLER ULTRASOUND TECHNIQUE: Gray-scale sonography with graded compression, as well as color Doppler and duplex ultrasound were performed to evaluate the lower extremity deep venous systems from the level of the common femoral vein and including the common femoral, femoral, profunda femoral, popliteal and calf veins including the posterior tibial, peroneal and gastrocnemius veins when visible. The superficial  great saphenous vein was also interrogated. Spectral Doppler was utilized to evaluate flow at rest and with distal augmentation maneuvers in the common femoral, femoral and popliteal veins. COMPARISON:  09/02/2018, 01/14/2018 FINDINGS: Contralateral Common Femoral Vein: Respiratory phasicity is normal and symmetric with the symptomatic side. No evidence of thrombus. Normal compressibility. Common Femoral Vein: No evidence of thrombus. Normal compressibility, respiratory phasicity and response to augmentation. Saphenofemoral Junction: No evidence of thrombus. Normal compressibility and flow on color Doppler imaging. Profunda Femoral Vein: No evidence of thrombus. Normal compressibility and flow on color Doppler imaging. Femoral Vein: No evidence of thrombus. Normal compressibility, respiratory phasicity and response to augmentation.  Popliteal Vein: No evidence of thrombus. Normal compressibility, respiratory phasicity and response to augmentation. Calf Veins: No evidence of thrombus. Normal compressibility and flow on color Doppler imaging. Superficial Great Saphenous Vein: No evidence of thrombus. Normal compressibility and flow on color Doppler imaging. Other Findings: Subjectively there is improved waveform the left common femoral vein with improved phasicity compared to the prior IMPRESSION: Sonographic survey left lower extremity negative for DVT Electronically Signed   By: Gilmer MorJaime  Belanna Manring D.O.   On: 11/20/2018 09:46   Ir Radiologist Eval & Mgmt  Result Date: 11/20/2018 Please refer to notes tab for details about interventional procedure. (Op Note)   Labs:  CBC: Recent Labs    01/10/18 0950 05/13/18 0853 09/09/18 0908 10/15/18 0755  WBC 8.7 7.3 8.1 7.6  HGB 13.8 13.7 13.9 12.4  HCT 42.4 41.8 42.7 39.5  PLT 216 231 231 199    COAGS: Recent Labs    10/15/18 0755  INR 1.1  APTT 30    BMP: Recent Labs    01/10/18 0950 05/13/18 0853 09/09/18 0908 10/15/18 0755  NA 143 141 142 142   K 4.9 4.2 4.8 4.4  CL 104 104 104 107  CO2 30 30 31 27   GLUCOSE 105* 95 100* 105*  BUN 15 18 14 12   CALCIUM 10.0 9.7 9.7 8.8*  CREATININE 0.93 0.94 0.95 0.96  GFRNONAA >60 >60 >60 >60  GFRAA >60 >60 >60 >60    LIVER FUNCTION TESTS: Recent Labs    01/10/18 0950 05/13/18 0853 09/09/18 0908  BILITOT 0.3 0.4 0.4  AST 35 22 18  ALT 44 27 22  ALKPHOS 79 55 61  PROT 7.7 7.0 7.3  ALBUMIN 4.4 4.8 4.7    TUMOR MARKERS: No results for input(s): AFPTM, CEA, CA199, CHROMGRNA in the last 8760 hours.  Assessment and Plan:  Beverly Ingram is a 53 year old female with history of Post-Thrombotic Syndrome of the left leg, now SP treatment of left May-Thurner/Rokitanski lesion with IVUS & dedicated venous stenting of the left iliac vein.   She tells me that she has complete resolution of her symptoms, and seems very pleased with the result.   Objectively, her left leg circumference is very much improved, with asymmetry: Thigh: 4cm greater on left previous, now 0.5cm greater on left Calf: 5cm greater on left previous, now 1cm greater on left Ankle: 3cm greater on left previous, now equal left to right  At this point, we would not schedule any further follow up or imaging.  She knows that she will be discussing the utility of continuing blood thinners with her hematology team.  As far as the aspirin, we would recommend at least 3-6 months after the stent placement to allow endothelial ingrowth.  She may benefit from life-long baby aspirin, but could come off this if need be after 3-6 months.   We always recommend compression stockings for a history of DVT and post-thrombotic syndrome.  She understands.    Plan: - Continue current therapy, with continuing 81mg  ASA at least 3-6 months after stent placement.  - Blood thinners to be managed by her hematology team - We are happy to see her on as-needed basis, but will not schedule follow up at this time.   Electronically Signed: Gilmer MorJaime Carless Slatten  11/20/2018, 9:56 AM   I spent a total of    25 Minutes in face to face in clinical consultation, greater than 50% of which was counseling/coordinating care for left leg post-thrombotic syndrome, SP venous  stent placement of iliac vein.

## 2018-11-26 IMAGING — US US EXTREM LOW VENOUS*L*
1 series · 13 of 24 positions shown · non-contrast
Comparison: Left lower extremity venous Doppler
ultrasound-09/06/2017

CLINICAL DATA: History of DVT with worsening lower extremity pain
and edema. Patient is currently on anticoagulation. Evaluate for
acute or chronic DVT.



[Series 1: us extrem low venous*left* · 0.09mm/px · 13 of 39 slices shown]
[im 1/39]
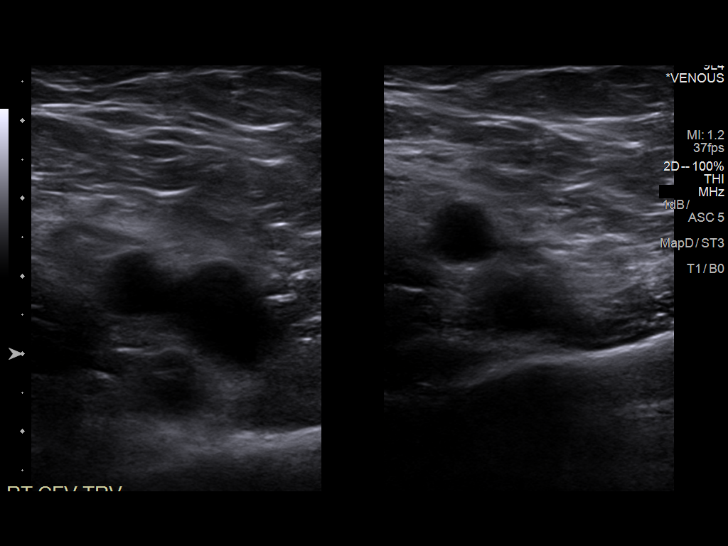
[im 4/39]
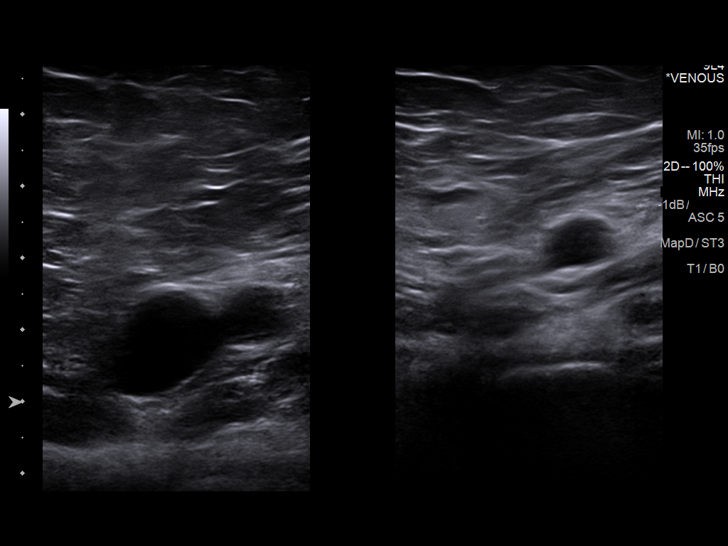
[im 7/39]
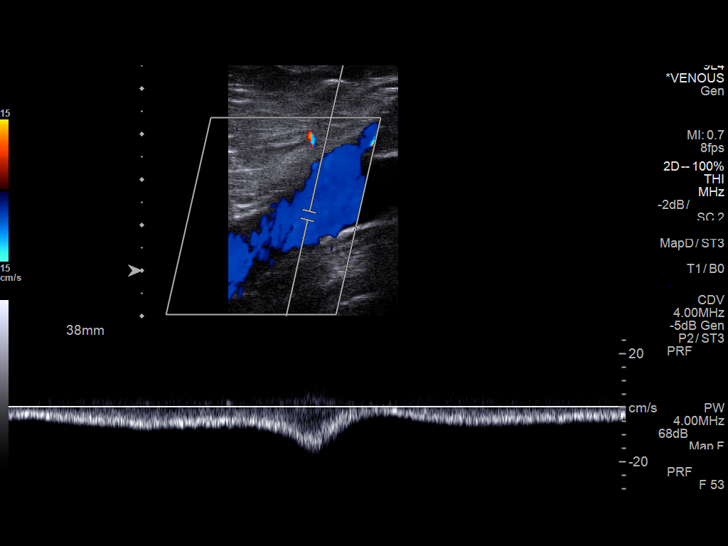
[im 10/39]
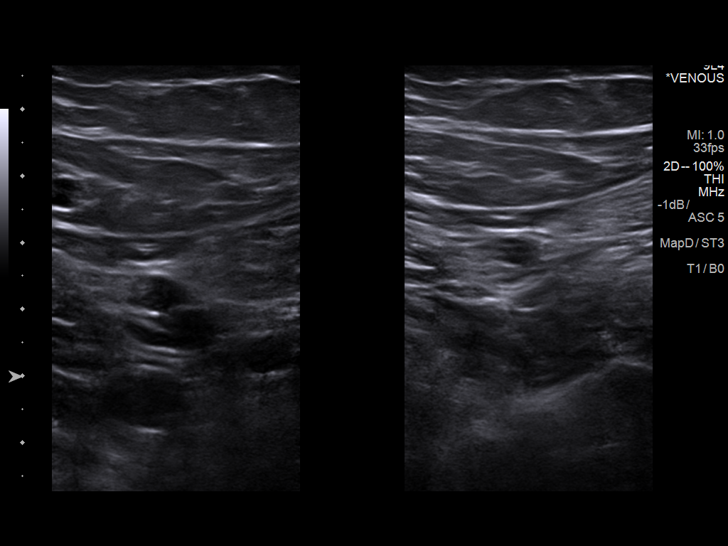
[im 14/39]
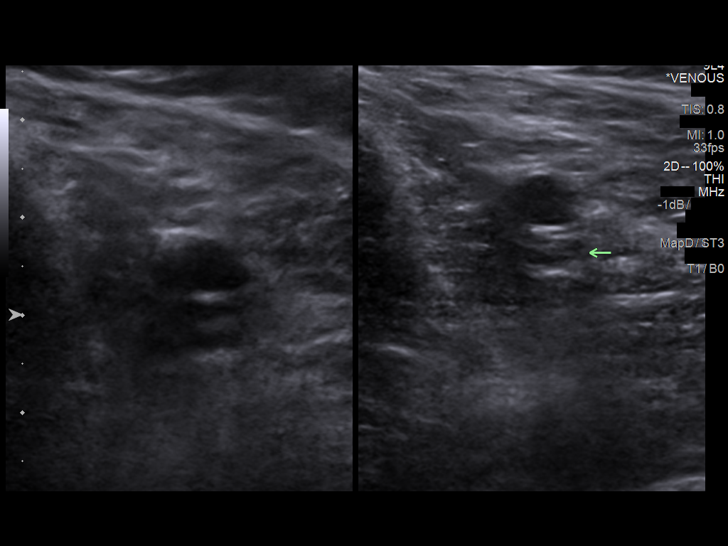
[im 17/39]
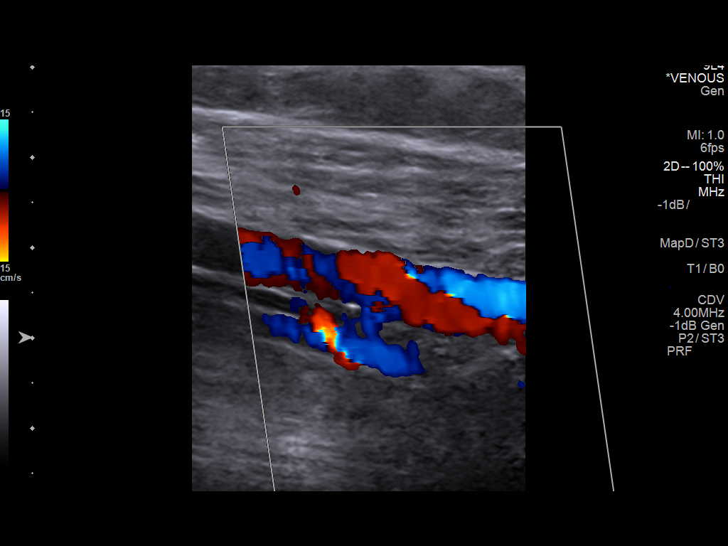
[im 20/39]
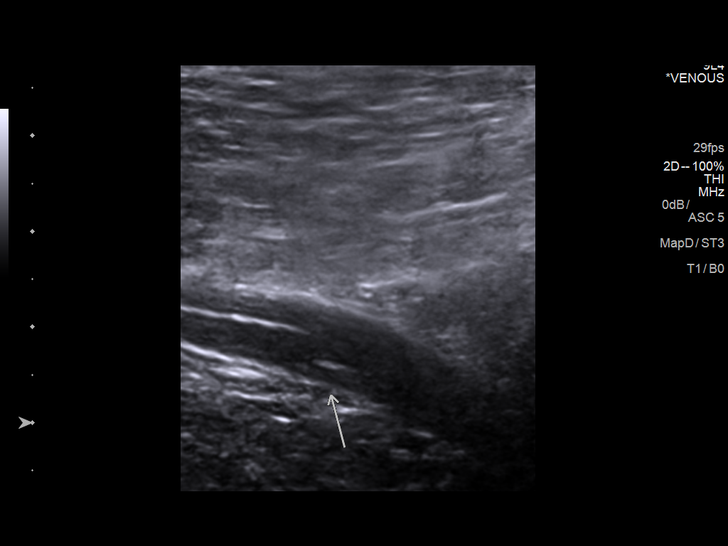
[im 22/39]
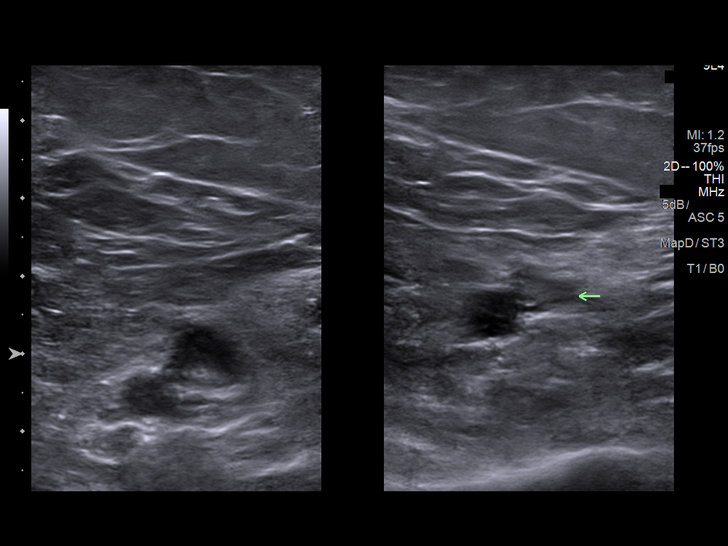
[im 25/39]
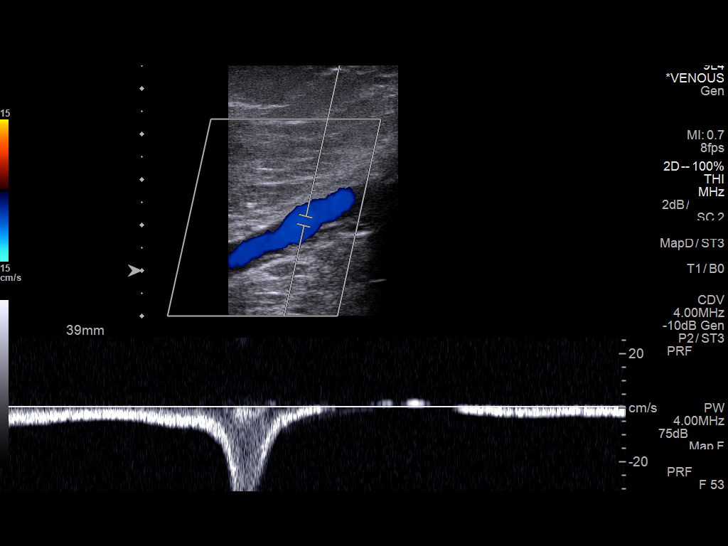
[im 29/39]
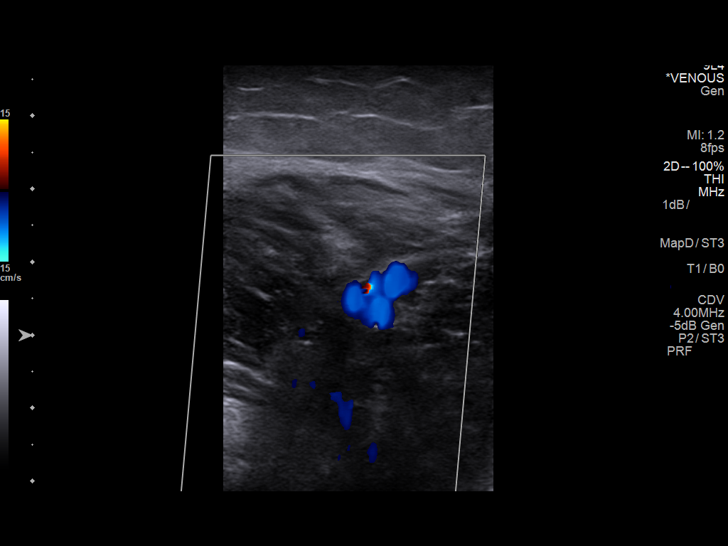
[im 32/39]
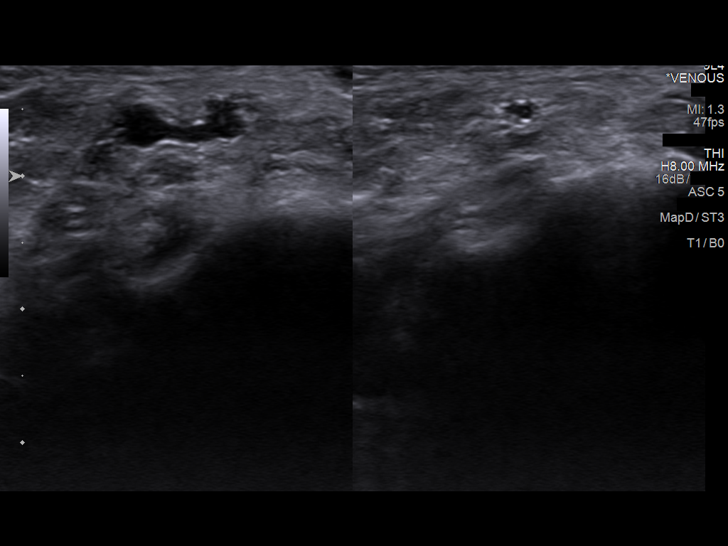
[im 35/39]
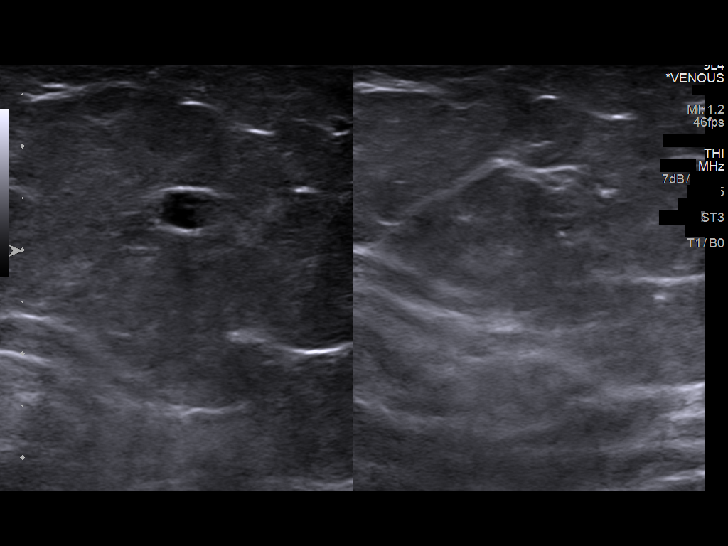
[im 39/39]
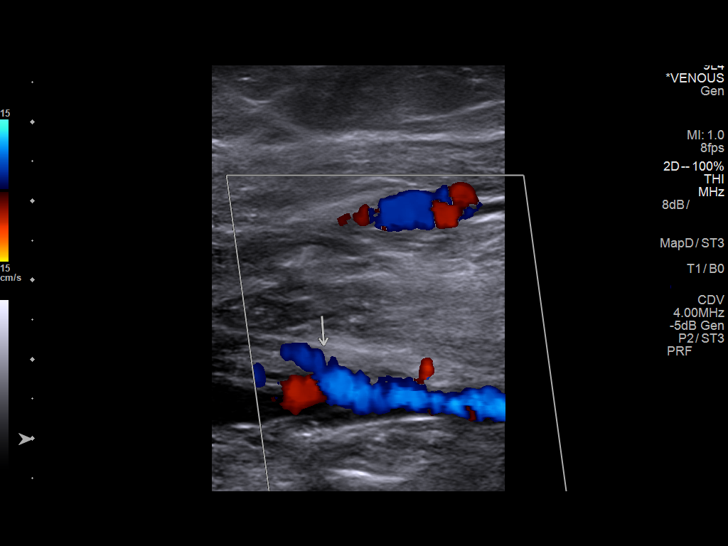

[13 of 24 positions shown; findings below may reference images not displayed]

FINDINGS: Contralateral Common Femoral Vein: Respiratory phasicity is normal
and symmetric with the symptomatic side. No evidence of thrombus.
Normal compressibility.

Common Femoral Vein: No evidence of acute or chronic thrombus.
Normal compressibility, respiratory phasicity and response to
augmentation.

Saphenofemoral Junction: No evidence of acute or chronic thrombus.
Normal compressibility and flow on color Doppler imaging.

Profunda Femoral Vein: No evidence of acute or chronic thrombus.
Normal compressibility and flow on color Doppler imaging.

Femoral Vein: Grossly unchanged mixed echogenic nonocclusive DVT
involving the proximal (image 16) mid (image 19) and distal (image
23) aspects of the left femoral vein.

Popliteal Vein: Grossly unchanged mixed echogenic nonocclusive DVT
involving the proximal aspect of the left popliteal vein (image 26).

Calf Veins: No evidence of thrombus. Normal compressibility and flow
on color Doppler imaging.

Superficial Great Saphenous Vein: No evidence of thrombus. Normal
compressibility.

Venous Reflux:  None.

Other Findings:  None.
IMPRESSION: 1. No evidence of acute or worsening chronic DVT within the left
lower extremity.
2. Unchanged chronic nonocclusive DVT involving the left femoral and
popliteal veins as detailed above.

## 2018-12-18 DIAGNOSIS — F411 Generalized anxiety disorder: Secondary | ICD-10-CM | POA: Diagnosis not present

## 2019-01-09 ENCOUNTER — Inpatient Hospital Stay (HOSPITAL_BASED_OUTPATIENT_CLINIC_OR_DEPARTMENT_OTHER): Payer: BC Managed Care – PPO | Admitting: Family

## 2019-01-09 ENCOUNTER — Encounter: Payer: Self-pay | Admitting: Family

## 2019-01-09 ENCOUNTER — Other Ambulatory Visit: Payer: Self-pay

## 2019-01-09 ENCOUNTER — Inpatient Hospital Stay: Payer: BC Managed Care – PPO | Attending: Family

## 2019-01-09 VITALS — BP 123/68 | HR 66 | Temp 96.9°F | Resp 19 | Ht 67.0 in | Wt 232.0 lb

## 2019-01-09 DIAGNOSIS — I82432 Acute embolism and thrombosis of left popliteal vein: Secondary | ICD-10-CM | POA: Diagnosis not present

## 2019-01-09 DIAGNOSIS — Z79899 Other long term (current) drug therapy: Secondary | ICD-10-CM | POA: Diagnosis not present

## 2019-01-09 DIAGNOSIS — I82512 Chronic embolism and thrombosis of left femoral vein: Secondary | ICD-10-CM | POA: Insufficient documentation

## 2019-01-09 DIAGNOSIS — Z7901 Long term (current) use of anticoagulants: Secondary | ICD-10-CM | POA: Diagnosis not present

## 2019-01-09 LAB — CBC WITH DIFFERENTIAL (CANCER CENTER ONLY)
Abs Immature Granulocytes: 0.03 10*3/uL (ref 0.00–0.07)
Basophils Absolute: 0.1 10*3/uL (ref 0.0–0.1)
Basophils Relative: 1 %
Eosinophils Absolute: 0.2 10*3/uL (ref 0.0–0.5)
Eosinophils Relative: 2 %
HCT: 41.7 % (ref 36.0–46.0)
Hemoglobin: 13.3 g/dL (ref 12.0–15.0)
Immature Granulocytes: 0 %
Lymphocytes Relative: 19 %
Lymphs Abs: 1.5 10*3/uL (ref 0.7–4.0)
MCH: 30.5 pg (ref 26.0–34.0)
MCHC: 31.9 g/dL (ref 30.0–36.0)
MCV: 95.6 fL (ref 80.0–100.0)
Monocytes Absolute: 0.5 10*3/uL (ref 0.1–1.0)
Monocytes Relative: 6 %
Neutro Abs: 5.8 10*3/uL (ref 1.7–7.7)
Neutrophils Relative %: 72 %
Platelet Count: 230 10*3/uL (ref 150–400)
RBC: 4.36 MIL/uL (ref 3.87–5.11)
RDW: 12.9 % (ref 11.5–15.5)
WBC Count: 8.1 10*3/uL (ref 4.0–10.5)
nRBC: 0 % (ref 0.0–0.2)

## 2019-01-09 LAB — CMP (CANCER CENTER ONLY)
ALT: 34 U/L (ref 0–44)
AST: 26 U/L (ref 15–41)
Albumin: 4.5 g/dL (ref 3.5–5.0)
Alkaline Phosphatase: 66 U/L (ref 38–126)
Anion gap: 6 (ref 5–15)
BUN: 9 mg/dL (ref 6–20)
CO2: 31 mmol/L (ref 22–32)
Calcium: 9.5 mg/dL (ref 8.9–10.3)
Chloride: 105 mmol/L (ref 98–111)
Creatinine: 1.01 mg/dL — ABNORMAL HIGH (ref 0.44–1.00)
GFR, Est AFR Am: >60 mL/min (ref >60)
GFR, Estimated: >60 mL/min (ref >60)
Glucose, Bld: 81 mg/dL (ref 70–99)
Potassium: 3.9 mmol/L (ref 3.5–5.1)
Sodium: 142 mmol/L (ref 135–145)
Total Bilirubin: 0.4 mg/dL (ref 0.3–1.2)
Total Protein: 7 g/dL (ref 6.5–8.1)

## 2019-01-09 LAB — D-DIMER, QUANTITATIVE: D-Dimer, Quant: 0.41 ug/mL-FEU (ref 0.00–0.50)

## 2019-01-09 MED ORDER — APIXABAN 2.5 MG PO TABS: 2.5000 mg | ORAL_TABLET | Freq: Two times a day (BID) | ORAL | 2 refills | Status: DC

## 2019-01-09 NOTE — Progress Notes (Signed)
Hematology and Oncology Follow Up Visit  Beverly BentMona Ingram [id number] [birth year] 53 y.o. 01/09/2019   Principle Diagnosis:  Left lower extremity DVT  Current Therapy:   Eliquis 2.5 mg PO BID   Interim History:  Beverly Ingram is here today for follow-up. She had a venocavagram Ivc with Dr. 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 Stent was placed in the common iliac vein.  She has had amazing results and is no longer having any pain or swelling.  She is doing well on Eliquis 5 mg PO BID as well as 1 baby aspirin daily since stent placement.  No episodes of bleeding. No bruising or petechiae.  No fever, chills, n/v ,cough, rash, dizziness, SOB, chest pain, palpitations, abdominal pain or changes in bowel or bladder habits.  No swelling, tenderness, numbness or tingling in her extremities.  She has maintained a good appetite and is staying well hydrated. Her weight is stable.   ECOG Performance Status: 1 - Symptomatic but completely ambulatory  Medications:  Allergies as of 01/09/2019   No Known Allergies     Medication List       Accurate as of January 09, 2019  9:49 AM. If you have any questions, ask your nurse or doctor.        apixaban 5 MG Tabs tablet Commonly known as: Eliquis Take 1 tablet (5 mg total) by mouth 2 (two) times daily.   buPROPion 150 MG 24 hr tablet Commonly known as: WELLBUTRIN XL Take 150 mg by mouth daily.   cetirizine 10 MG tablet Commonly known as: ZYRTEC Take 10 mg by mouth daily.   enoxaparin 150 MG/ML injection Commonly known as: Lovenox Inject 1 mL (150 mg total) into the skin daily for 30 days.   rOPINIRole 0.25 MG tablet Commonly known as: REQUIP Take 0.25 mg by mouth at bedtime as needed (restless legs).   sertraline 50 MG tablet Commonly known as: ZOLOFT Take 50 mg by mouth daily.   traMADol 50 MG tablet Commonly known as: ULTRAM TAKE 1 TABLET (50 MG TOTAL) BY MOUTH  EVERY 6 (SIX) HOURS AS NEEDED.   vitamin B-12 1000 MCG tablet Commonly known as: CYANOCOBALAMIN Take 1,000 mcg by mouth daily.       Allergies: No Known Allergies  Past Medical History, Surgical history, Social history, and Family History were reviewed and updated.  Review of Systems: All other 10 point review of systems is negative.   Physical Exam:  vitals were not taken for this visit.   Wt Readings from Last 3 Encounters:  10/15/18 225 lb (102.1 kg)  09/09/18 228 lb (103.4 kg)  05/13/18 219 lb 12 oz (99.7 kg)    Ocular: Sclerae unicteric, pupils equal, round and reactive to light Ear-nose-throat: Oropharynx clear, dentition fair Lymphatic: No cervical or supraclavicular adenopathy Lungs no rales or rhonchi, good excursion bilaterally Heart regular rate and rhythm, no murmur appreciated Abd soft, nontender, positive bowel sounds, no liver or spleen tip palpated on exam, no fluid wave  MSK no focal spinal tenderness, no joint edema Neuro: non-focal, well-oriented, appropriate affect Breasts: Deferred   Lab Results  Component Value Date   WBC 8.1 01/09/2019   HGB 13.3 01/09/2019   HCT 41.7 01/09/2019   MCV 95.6 01/09/2019   PLT 230 01/09/2019   No results found for: FERRITIN, IRON, TIBC, UIBC, IRONPCTSAT Lab Results  Component Value Date   RBC 4.36 01/09/2019   No results found for:  KPAFRELGTCHN, LAMBDASER, KAPLAMBRATIO No results found for: IGGSERUM, IGA, IGMSERUM No results found for: Odetta Pink, SPEI   Chemistry      Component Value Date/Time   NA 142 01/09/2019 0912   NA 142 03/12/2017 0843   K 3.9 01/09/2019 0912   K 4.7 03/12/2017 0843   CL 105 01/09/2019 0912   CL 107 01/15/2017 0801   CO2 31 01/09/2019 0912   CO2 27 03/12/2017 0843   BUN 9 01/09/2019 0912   BUN 14.4 03/12/2017 0843   CREATININE 1.01 (H) 01/09/2019 0912   CREATININE 0.8 03/12/2017 0843      Component Value Date/Time    CALCIUM 9.5 01/09/2019 0912   CALCIUM 9.4 03/12/2017 0843   ALKPHOS 66 01/09/2019 0912   ALKPHOS 69 03/12/2017 0843   AST 26 01/09/2019 0912   AST 28 03/12/2017 0843   ALT 34 01/09/2019 0912   ALT 38 03/12/2017 0843   BILITOT 0.4 01/09/2019 0912   BILITOT 0.25 03/12/2017 0843       Impression and Plan: Beverly Ingram is a very pleasant 53 yo caucasian female with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 She had a successful stenting of the common iliac vein and is doing well.  We will have her reduce her Eliquis to 2.5 mg PO daily.  We will plan to see her back in another 4 months.  She will contact our office with any questions or concerns. We can certainly see her sooner if needed.   Laverna Peace, NP 9/18/20209:49 AM

## 2019-01-12 ENCOUNTER — Telehealth: Payer: Self-pay | Admitting: Hematology & Oncology

## 2019-01-12 NOTE — Telephone Encounter (Signed)
Called spoke with patient regarding follow up appts added per 9/18 los

## 2019-04-25 ENCOUNTER — Other Ambulatory Visit: Payer: Self-pay | Admitting: Family

## 2019-04-25 DIAGNOSIS — I82432 Acute embolism and thrombosis of left popliteal vein: Secondary | ICD-10-CM

## 2019-05-12 ENCOUNTER — Other Ambulatory Visit: Payer: BC Managed Care – PPO

## 2019-05-12 ENCOUNTER — Ambulatory Visit: Payer: BC Managed Care – PPO | Admitting: Hematology & Oncology

## 2019-05-15 ENCOUNTER — Inpatient Hospital Stay (HOSPITAL_BASED_OUTPATIENT_CLINIC_OR_DEPARTMENT_OTHER): Payer: BC Managed Care – PPO | Admitting: Hematology & Oncology

## 2019-05-15 ENCOUNTER — Other Ambulatory Visit: Payer: Self-pay

## 2019-05-15 ENCOUNTER — Inpatient Hospital Stay: Payer: BC Managed Care – PPO | Attending: Hematology & Oncology

## 2019-05-15 ENCOUNTER — Encounter: Payer: Self-pay | Admitting: Hematology & Oncology

## 2019-05-15 VITALS — BP 121/58 | HR 70 | Temp 97.6°F | Resp 18 | Wt 227.8 lb

## 2019-05-15 DIAGNOSIS — Z7901 Long term (current) use of anticoagulants: Secondary | ICD-10-CM | POA: Insufficient documentation

## 2019-05-15 DIAGNOSIS — I82512 Chronic embolism and thrombosis of left femoral vein: Secondary | ICD-10-CM | POA: Diagnosis present

## 2019-05-15 DIAGNOSIS — I82432 Acute embolism and thrombosis of left popliteal vein: Secondary | ICD-10-CM

## 2019-05-15 LAB — CBC WITH DIFFERENTIAL (CANCER CENTER ONLY)
Abs Immature Granulocytes: 0.03 10*3/uL (ref 0.00–0.07)
Basophils Absolute: 0.1 10*3/uL (ref 0.0–0.1)
Basophils Relative: 1 %
Eosinophils Absolute: 0.1 10*3/uL (ref 0.0–0.5)
Eosinophils Relative: 2 %
HCT: 42.9 % (ref 36.0–46.0)
Hemoglobin: 13.8 g/dL (ref 12.0–15.0)
Immature Granulocytes: 0 %
Lymphocytes Relative: 19 %
Lymphs Abs: 1.6 10*3/uL (ref 0.7–4.0)
MCH: 29.9 pg (ref 26.0–34.0)
MCHC: 32.2 g/dL (ref 30.0–36.0)
MCV: 93.1 fL (ref 80.0–100.0)
Monocytes Absolute: 0.4 10*3/uL (ref 0.1–1.0)
Monocytes Relative: 5 %
Neutro Abs: 6.1 10*3/uL (ref 1.7–7.7)
Neutrophils Relative %: 73 %
Platelet Count: 248 10*3/uL (ref 150–400)
RBC: 4.61 MIL/uL (ref 3.87–5.11)
RDW: 12.8 % (ref 11.5–15.5)
WBC Count: 8.4 10*3/uL (ref 4.0–10.5)
nRBC: 0 % (ref 0.0–0.2)

## 2019-05-15 LAB — CMP (CANCER CENTER ONLY)
ALT: 24 U/L (ref 0–44)
AST: 20 U/L (ref 15–41)
Albumin: 4.9 g/dL (ref 3.5–5.0)
Alkaline Phosphatase: 74 U/L (ref 38–126)
Anion gap: 8 (ref 5–15)
BUN: 19 mg/dL (ref 6–20)
CO2: 30 mmol/L (ref 22–32)
Calcium: 10 mg/dL (ref 8.9–10.3)
Chloride: 102 mmol/L (ref 98–111)
Creatinine: 0.96 mg/dL (ref 0.44–1.00)
GFR, Est AFR Am: >60 mL/min (ref >60)
GFR, Estimated: >60 mL/min (ref >60)
Glucose, Bld: 141 mg/dL — ABNORMAL HIGH (ref 70–99)
Potassium: 5 mmol/L (ref 3.5–5.1)
Sodium: 140 mmol/L (ref 135–145)
Total Bilirubin: 0.5 mg/dL (ref 0.3–1.2)
Total Protein: 7.8 g/dL (ref 6.5–8.1)

## 2019-05-15 LAB — D-DIMER, QUANTITATIVE: D-Dimer, Quant: 0.34 ug/mL-FEU (ref 0.00–0.50)

## 2019-05-15 NOTE — Progress Notes (Signed)
Hematology and Oncology Follow Up Visit  Beverly Ingram [id number] 11/17/1965 54 y.o. 05/15/2019   Principle Diagnosis:  Left lower extremity DVT  Current Therapy:   Eliquis 2.5 mg PO BID -- d/c on 05/20/2019 ASA 162 mg po q day   Interim History:  Ms. Beverly Ingram is here today for follow-up.  She really feels well.  She is very happy about the results from the stent that was placed.  She really has a lot of complementary things to say about interventional radiology.  I totally agree with her.  They are truly a very good bunch of people in that department.  Her left leg is still little bit swollen but not bad.  She really does not wear her compression stocking.  She will finish up the Eliquis in about a week.  At that time, she will go onto aspirin at 162 mg daily.  She has had no problems with bleeding.  There is no abdominal pain.  She has had no cough or shortness of breath.  There is no chest wall pain..  She has had no change in bowel or bladder habits.  She has had no fever.  She is being very cautious with the coronavirus.  Overall, her performance status is ECOG 0.   Medications:  Allergies as of 05/15/2019   No Known Allergies     Medication List       Accurate as of May 15, 2019 10:12 AM. If you have any questions, ask your nurse or doctor.        buPROPion 150 MG 24 hr tablet Commonly known as: WELLBUTRIN XL Take 150 mg by mouth daily.   cetirizine 10 MG tablet Commonly known as: ZYRTEC Take 10 mg by mouth daily.   Eliquis 2.5 MG Tabs tablet Generic drug: apixaban TAKE 1 TABLET BY MOUTH TWICE A DAY   rOPINIRole 0.25 MG tablet Commonly known as: REQUIP Take 0.25 mg by mouth at bedtime as needed (restless legs).   sertraline 50 MG tablet Commonly known as: ZOLOFT Take 50 mg by mouth daily.   traMADol 50 MG tablet Commonly known as: ULTRAM TAKE 1 TABLET (50 MG TOTAL) BY MOUTH EVERY 6 (SIX) HOURS AS NEEDED.   vitamin B-12 1000 MCG tablet Commonly known  as: CYANOCOBALAMIN Take 1,000 mcg by mouth daily.       Allergies: No Known Allergies  Past Medical History, Surgical history, Social history, and Family History were reviewed and updated.  Review of Systems: Review of Systems  Constitutional: Negative.   HENT: Negative.   Eyes: Negative.   Respiratory: Negative.   Cardiovascular: Negative.   Gastrointestinal: Negative.   Genitourinary: Negative.   Musculoskeletal: Negative.   Skin: Negative.   Neurological: Negative.   Endo/Heme/Allergies: Negative.   Psychiatric/Behavioral: Negative.    Marland Kitchen   Physical Exam:  weight is 227 lb 12 oz (103.3 kg). Her temporal temperature is 97.6 F (36.4 C). Her blood pressure is 121/58 (abnormal) and her pulse is 70. Her respiration is 18 and oxygen saturation is 98%.   Wt Readings from Last 3 Encounters:  05/15/19 227 lb 12 oz (103.3 kg)  01/09/19 232 lb (105.2 kg)  10/15/18 225 lb (102.1 kg)    Physical Exam Vitals reviewed.  HENT:     Head: Normocephalic and atraumatic.  Eyes:     Pupils: Pupils are equal, round, and reactive to light.  Cardiovascular:     Rate and Rhythm: Normal rate and regular rhythm.     Heart  sounds: Normal heart sounds.  Pulmonary:     Effort: Pulmonary effort is normal.     Breath sounds: Normal breath sounds.  Abdominal:     General: Bowel sounds are normal.     Palpations: Abdomen is soft.  Musculoskeletal:        General: No tenderness or deformity. Normal range of motion.     Cervical back: Normal range of motion.     Comments: Lower extremities shows mild nonpitting edema of the left leg.  She has good pulses in her distal extremities.  She has no palpable venous cord in either leg.  She has a negative Homans sign bilaterally.  Lymphadenopathy:     Cervical: No cervical adenopathy.  Skin:    General: Skin is warm and dry.     Findings: No erythema or rash.  Neurological:     Mental Status: She is alert and oriented to person, place, and time.    Psychiatric:        Behavior: Behavior normal.        Thought Content: Thought content normal.        Judgment: Judgment normal.      Lab Results  Component Value Date   WBC 8.4 05/15/2019   HGB 13.8 05/15/2019   HCT 42.9 05/15/2019   MCV 93.1 05/15/2019   PLT 248 05/15/2019   No results found for: FERRITIN, IRON, TIBC, UIBC, IRONPCTSAT Lab Results  Component Value Date   RBC 4.61 05/15/2019   No results found for: KPAFRELGTCHN, LAMBDASER, KAPLAMBRATIO No results found for: IGGSERUM, IGA, IGMSERUM No results found for: Odetta Pink, SPEI   Chemistry      Component Value Date/Time   NA 140 05/15/2019 0921   NA 142 03/12/2017 0843   K 5.0 05/15/2019 0921   K 4.7 03/12/2017 0843   CL 102 05/15/2019 0921   CL 107 01/15/2017 0801   CO2 30 05/15/2019 0921   CO2 27 03/12/2017 0843   BUN 19 05/15/2019 0921   BUN 14.4 03/12/2017 0843   CREATININE 0.96 05/15/2019 0921   CREATININE 0.8 03/12/2017 0843      Component Value Date/Time   CALCIUM 10.0 05/15/2019 0921   CALCIUM 9.4 03/12/2017 0843   ALKPHOS 74 05/15/2019 0921   ALKPHOS 69 03/12/2017 0843   AST 20 05/15/2019 0921   AST 28 03/12/2017 0843   ALT 24 05/15/2019 0921   ALT 38 03/12/2017 0843   BILITOT 0.5 05/15/2019 0921   BILITOT 0.25 03/12/2017 0843       Impression and Plan: [name] is a very pleasant 54 yo caucasian female with a 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 She had a successful stenting of the common iliac vein and is doing well.   At this time, we will have her finish up what ever Eliquis that she has left.  I will then have her on aspirin at 162 mg a day.  I think that she should do quite well.  The stent clearly is the key I think for her to keep the vein open and not have another thromboembolic event.  I would like to see her back in 2 months.  1 make sure that everything is going well.     Volanda Napoleon, MD 1/22/202110:12 AM

## 2019-07-17 ENCOUNTER — Ambulatory Visit: Payer: BC Managed Care – PPO | Admitting: Hematology & Oncology

## 2019-07-17 ENCOUNTER — Other Ambulatory Visit: Payer: BC Managed Care – PPO

## 2019-07-23 ENCOUNTER — Inpatient Hospital Stay: Payer: BC Managed Care – PPO | Attending: Hematology & Oncology

## 2019-07-23 ENCOUNTER — Encounter: Payer: Self-pay | Admitting: Hematology & Oncology

## 2019-07-23 ENCOUNTER — Inpatient Hospital Stay (HOSPITAL_BASED_OUTPATIENT_CLINIC_OR_DEPARTMENT_OTHER): Payer: BC Managed Care – PPO | Admitting: Hematology & Oncology

## 2019-07-23 ENCOUNTER — Other Ambulatory Visit: Payer: Self-pay

## 2019-07-23 VITALS — BP 125/75 | HR 66 | Temp 96.9°F | Resp 18 | Wt 229.0 lb

## 2019-07-23 DIAGNOSIS — Z7982 Long term (current) use of aspirin: Secondary | ICD-10-CM | POA: Diagnosis not present

## 2019-07-23 DIAGNOSIS — Z79899 Other long term (current) drug therapy: Secondary | ICD-10-CM | POA: Insufficient documentation

## 2019-07-23 DIAGNOSIS — I82432 Acute embolism and thrombosis of left popliteal vein: Secondary | ICD-10-CM | POA: Diagnosis present

## 2019-07-23 DIAGNOSIS — I82512 Chronic embolism and thrombosis of left femoral vein: Secondary | ICD-10-CM | POA: Insufficient documentation

## 2019-07-23 DIAGNOSIS — Z7901 Long term (current) use of anticoagulants: Secondary | ICD-10-CM | POA: Insufficient documentation

## 2019-07-23 LAB — CMP (CANCER CENTER ONLY)
ALT: 25 U/L (ref 0–44)
AST: 19 U/L (ref 15–41)
Albumin: 4.7 g/dL (ref 3.5–5.0)
Alkaline Phosphatase: 68 U/L (ref 38–126)
Anion gap: 6 (ref 5–15)
BUN: 17 mg/dL (ref 6–20)
CO2: 31 mmol/L (ref 22–32)
Calcium: 9.8 mg/dL (ref 8.9–10.3)
Chloride: 104 mmol/L (ref 98–111)
Creatinine: 0.94 mg/dL (ref 0.44–1.00)
GFR, Est AFR Am: >60 mL/min (ref >60)
GFR, Estimated: >60 mL/min (ref >60)
Glucose, Bld: 106 mg/dL — ABNORMAL HIGH (ref 70–99)
Potassium: 4.8 mmol/L (ref 3.5–5.1)
Sodium: 141 mmol/L (ref 135–145)
Total Bilirubin: 0.4 mg/dL (ref 0.3–1.2)
Total Protein: 7.2 g/dL (ref 6.5–8.1)

## 2019-07-23 LAB — CBC WITH DIFFERENTIAL (CANCER CENTER ONLY)
Abs Immature Granulocytes: 0.05 10*3/uL (ref 0.00–0.07)
Basophils Absolute: 0.1 10*3/uL (ref 0.0–0.1)
Basophils Relative: 1 %
Eosinophils Absolute: 0.1 10*3/uL (ref 0.0–0.5)
Eosinophils Relative: 2 %
HCT: 42.9 % (ref 36.0–46.0)
Hemoglobin: 13.7 g/dL (ref 12.0–15.0)
Immature Granulocytes: 1 %
Lymphocytes Relative: 21 %
Lymphs Abs: 1.9 10*3/uL (ref 0.7–4.0)
MCH: 30.1 pg (ref 26.0–34.0)
MCHC: 31.9 g/dL (ref 30.0–36.0)
MCV: 94.3 fL (ref 80.0–100.0)
Monocytes Absolute: 0.6 10*3/uL (ref 0.1–1.0)
Monocytes Relative: 7 %
Neutro Abs: 6.2 10*3/uL (ref 1.7–7.7)
Neutrophils Relative %: 68 %
Platelet Count: 235 10*3/uL (ref 150–400)
RBC: 4.55 MIL/uL (ref 3.87–5.11)
RDW: 13 % (ref 11.5–15.5)
WBC Count: 8.9 10*3/uL (ref 4.0–10.5)
nRBC: 0 % (ref 0.0–0.2)

## 2019-07-23 LAB — D-DIMER, QUANTITATIVE: D-Dimer, Quant: 0.43 ug/mL-FEU (ref 0.00–0.50)

## 2019-07-23 NOTE — Progress Notes (Signed)
Hematology and Oncology Follow Up Visit  Beverly Ingram [id number] [birth year] 54 y.o. 07/23/2019   Principle Diagnosis:  Left lower extremity DVT  Current Therapy:   Eliquis 2.5 mg PO BID -- d/c on 05/20/2019 ASA 162 mg po q day   Interim History:  Beverly Ingram is here today for follow-up.  She is doing quite well.  She is now on aspirin.  She is on the 162 mg dose of aspirin.  She is having no problems.  Her left leg might be a little bit more swollen but this has been chronic.  She has had no cough or shortness of breath.  She has been active.  She has had no nausea or vomiting.  There is been no change in bowel or bladder habits.  She has had no headache.  She has not had her coronavirus vaccine yet.  Overall, her performance status is ECOG 0.   Medications:  Allergies as of 07/23/2019   No Known Allergies     Medication List       Accurate as of July 23, 2019 10:46 AM. If you have any questions, ask your nurse or doctor.        STOP taking these medications   Eliquis 2.5 MG Tabs tablet Generic drug: apixaban Stopped by: Josph Macho, MD   rOPINIRole 0.25 MG tablet Commonly known as: REQUIP Stopped by: Josph Macho, MD   traMADol 50 MG tablet Commonly known as: ULTRAM Stopped by: Josph Macho, MD     TAKE these medications   aspirin 81 MG EC tablet Take 162 mg by mouth daily. Swallow whole.   buPROPion 300 MG 24 hr tablet Commonly known as: WELLBUTRIN XL Take 300 mg by mouth daily. What changed: Another medication with the same name was removed. Continue taking this medication, and follow the directions you see here. Changed by: Josph Macho, MD   cetirizine 10 MG tablet Commonly known as: ZYRTEC Take 10 mg by mouth daily.   sertraline 50 MG tablet Commonly known as: ZOLOFT Take 50 mg by mouth daily.   vitamin B-12 1000 MCG tablet Commonly known as: CYANOCOBALAMIN Take 1,000 mcg by mouth daily.       Allergies: No Known Allergies  Past  Medical History, Surgical history, Social history, and Family History were reviewed and updated.  Review of Systems: Review of Systems  Constitutional: Negative.   HENT: Negative.   Eyes: Negative.   Respiratory: Negative.   Cardiovascular: Negative.   Gastrointestinal: Negative.   Genitourinary: Negative.   Musculoskeletal: Negative.   Skin: Negative.   Neurological: Negative.   Endo/Heme/Allergies: Negative.   Psychiatric/Behavioral: Negative.    Marland Kitchen   Physical Exam:  weight is 229 lb (103.9 kg). Her temporal temperature is 96.9 F (36.1 C) (abnormal). Her blood pressure is 125/75 and her pulse is 66. Her respiration is 18 and oxygen saturation is 99%.   Wt Readings from Last 3 Encounters:  07/23/19 229 lb (103.9 kg)  05/15/19 227 lb 12 oz (103.3 kg)  01/09/19 232 lb (105.2 kg)    Physical Exam Vitals reviewed.  HENT:     Head: Normocephalic and atraumatic.  Eyes:     Pupils: Pupils are equal, round, and reactive to light.  Cardiovascular:     Rate and Rhythm: Normal rate and regular rhythm.     Heart sounds: Normal heart sounds.  Pulmonary:     Effort: Pulmonary effort is normal.     Breath sounds: Normal breath  sounds.  Abdominal:     General: Bowel sounds are normal.     Palpations: Abdomen is soft.  Musculoskeletal:        General: No tenderness or deformity. Normal range of motion.     Cervical back: Normal range of motion.     Comments: Lower extremities shows mild nonpitting edema of the left leg.  She has good pulses in her distal extremities.  She has no palpable venous cord in either leg.  She has a negative Homans sign bilaterally.  Lymphadenopathy:     Cervical: No cervical adenopathy.  Skin:    General: Skin is warm and dry.     Findings: No erythema or rash.  Neurological:     Mental Status: She is alert and oriented to person, place, and time.  Psychiatric:        Behavior: Behavior normal.        Thought Content: Thought content normal.         Judgment: Judgment normal.      Lab Results  Component Value Date   WBC 8.9 07/23/2019   HGB 13.7 07/23/2019   HCT 42.9 07/23/2019   MCV 94.3 07/23/2019   PLT 235 07/23/2019   No results found for: FERRITIN, IRON, TIBC, UIBC, IRONPCTSAT Lab Results  Component Value Date   RBC 4.55 07/23/2019   No results found for: KPAFRELGTCHN, LAMBDASER, KAPLAMBRATIO No results found for: Kandis Cocking, IGMSERUM No results found for: Odetta Pink, SPEI   Chemistry      Component Value Date/Time   NA 141 07/23/2019 1007   NA 142 03/12/2017 0843   K 4.8 07/23/2019 1007   K 4.7 03/12/2017 0843   CL 104 07/23/2019 1007   CL 107 01/15/2017 0801   CO2 31 07/23/2019 1007   CO2 27 03/12/2017 0843   BUN 17 07/23/2019 1007   BUN 14.4 03/12/2017 0843   CREATININE 0.94 07/23/2019 1007   CREATININE 0.8 03/12/2017 0843      Component Value Date/Time   CALCIUM 9.8 07/23/2019 1007   CALCIUM 9.4 03/12/2017 0843   ALKPHOS 68 07/23/2019 1007   ALKPHOS 69 03/12/2017 0843   AST 19 07/23/2019 1007   AST 28 03/12/2017 0843   ALT 25 07/23/2019 1007   ALT 38 03/12/2017 0843   BILITOT 0.4 07/23/2019 1007   BILITOT 0.25 03/12/2017 0843       Impression and Plan: Beverly Ingram is a very pleasant 54 yo caucasian female with a 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 She had a successful stenting of the common iliac vein and is doing well.   For right now, we will let her go from the practice.  I just do not think that we need to see her back unless she has a problem.  Again, the stent that was put in I think really will be the important factor for her.  As always, we really have a great time talking with her.  She is so much fun to talk to.  It sounds like she is going to have a wonderful vacation in the [place] mountains this summer.    Volanda Napoleon, MD 4/1/202110:46 AM

## 2019-07-26 ENCOUNTER — Encounter: Payer: Self-pay | Admitting: Hematology & Oncology

## 2021-03-03 IMAGING — US VENOUS DOPPLER ULTRASOUND OF LEFT LOWER EXTREMITY
1 series · 13 of 24 positions shown · non-contrast
Comparison: Multiple prior most recent 01/14/2018

CLINICAL DATA: 53-year-old female with lower extremity DVT



[Series 1: venous doppler ultrasound of left lower extremity · 13 of 48 slices shown]
[im 1/48]
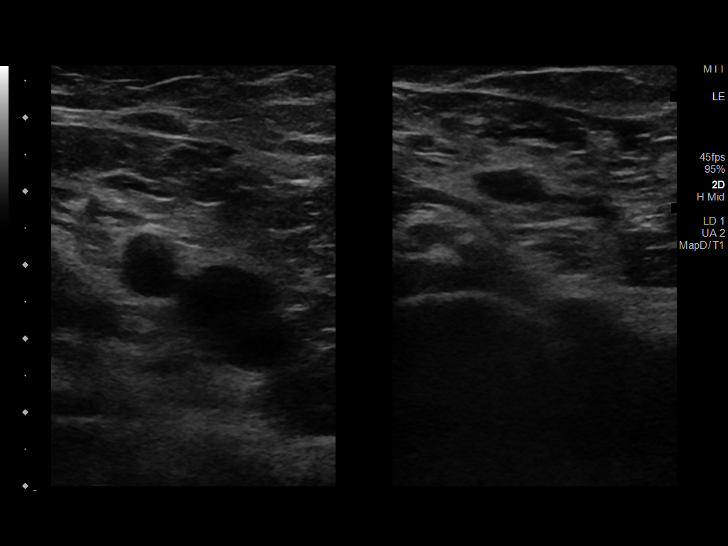
[im 5/48]
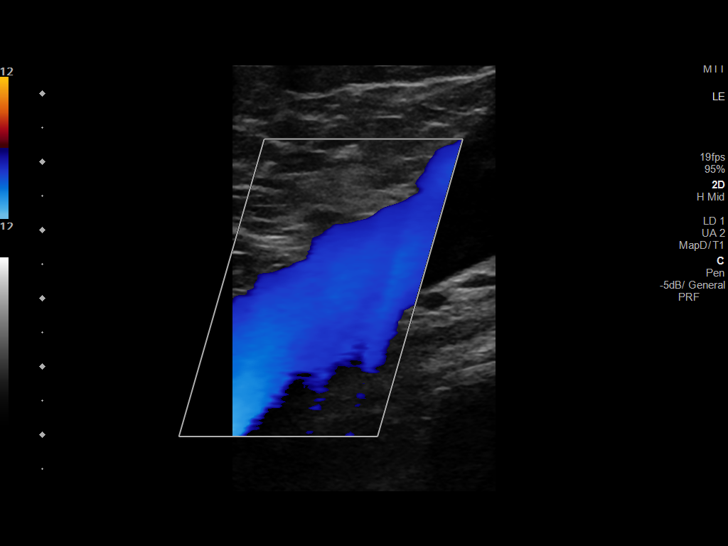
[im 9/48]
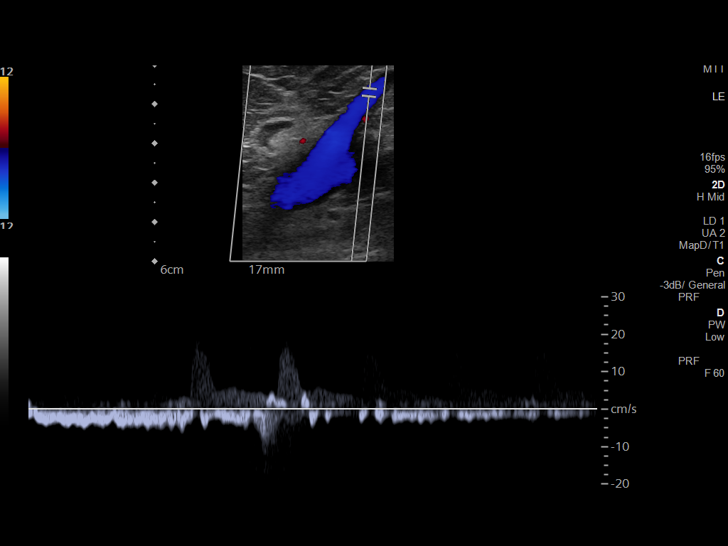
[im 13/48]
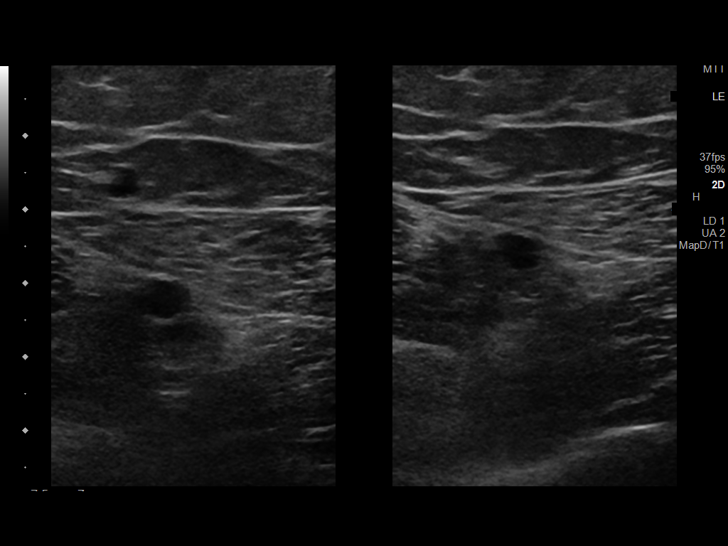
[im 17/48]
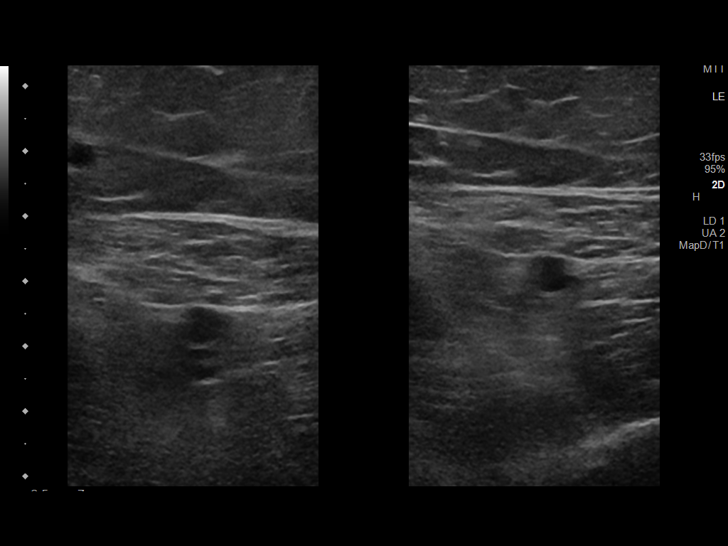
[im 21/48]
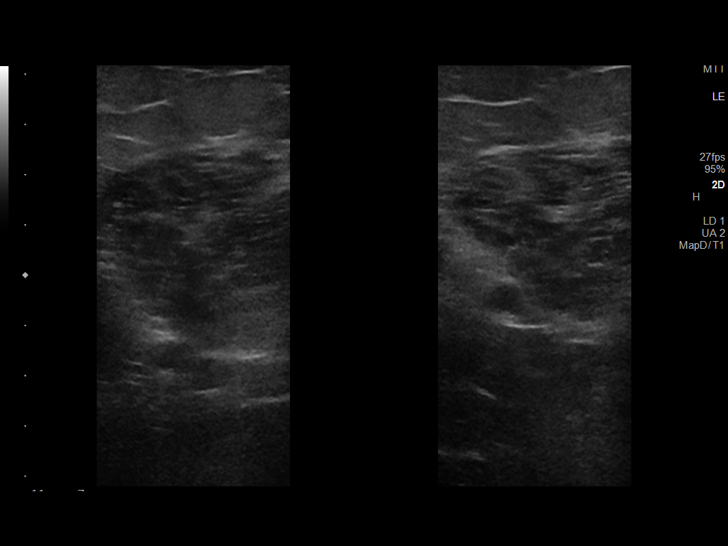
[im 25/48]
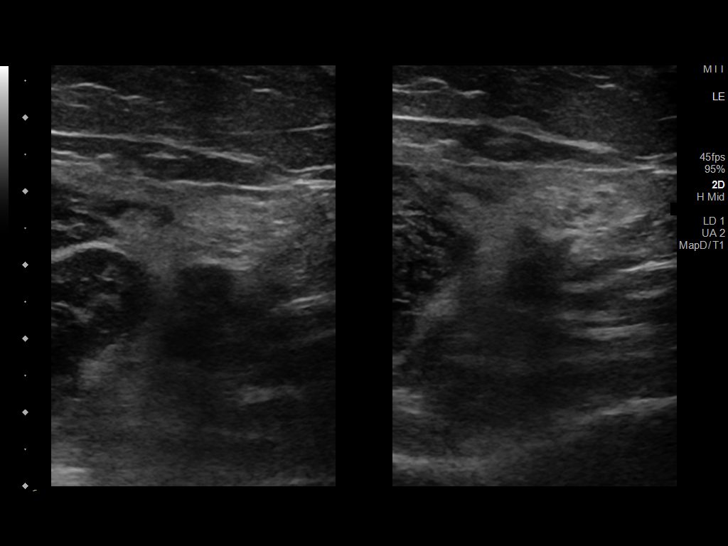
[im 27/48]
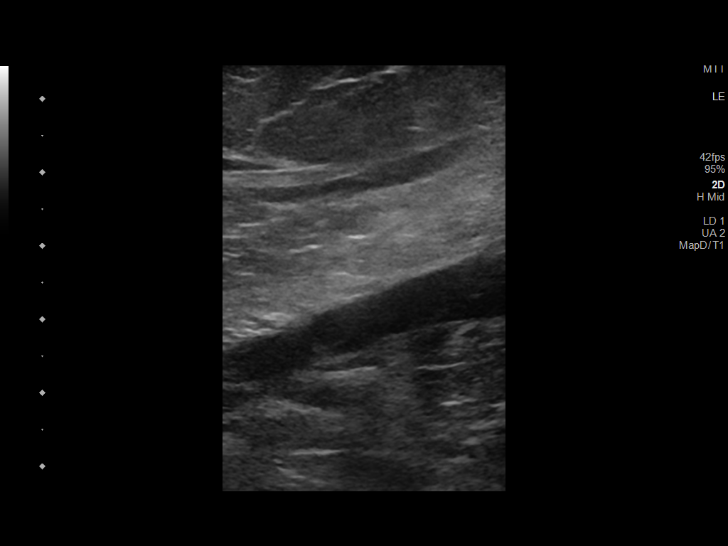
[im 31/48]
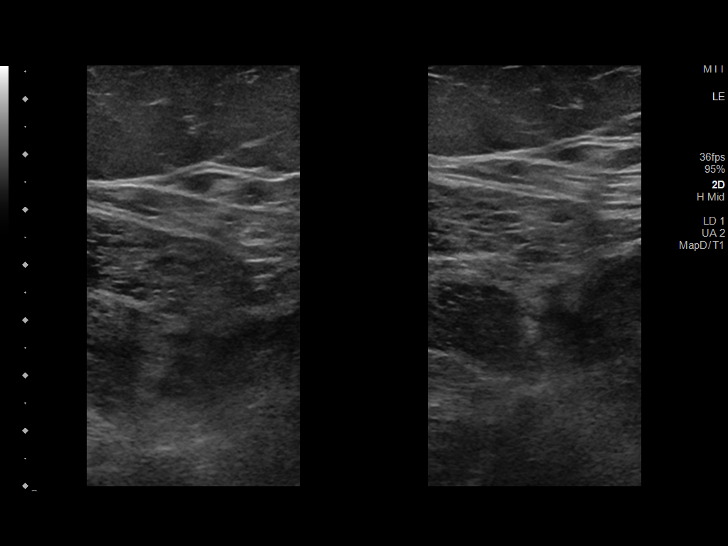
[im 35/48]
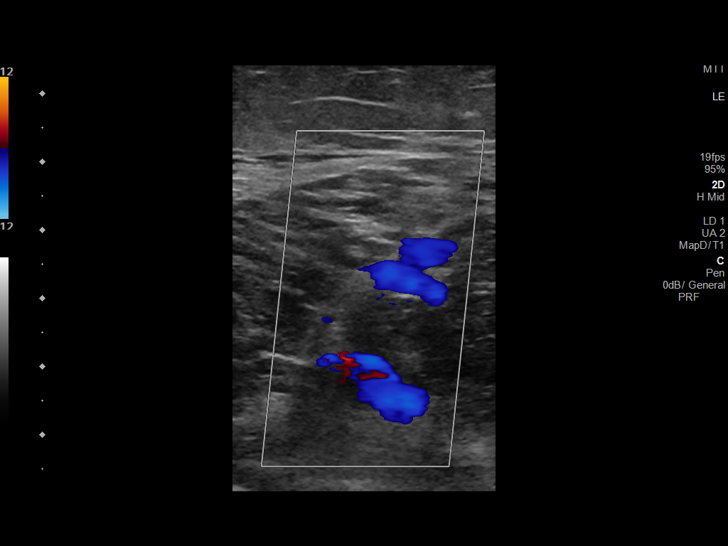
[im 39/48]
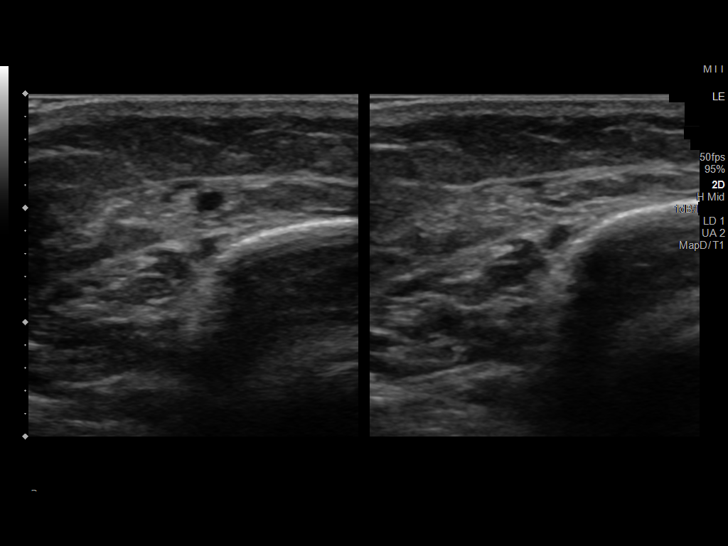
[im 43/48]
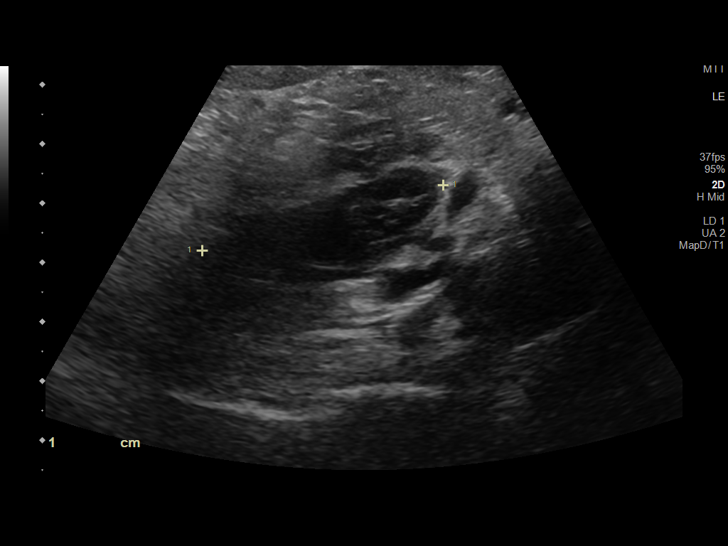
[im 48/48]
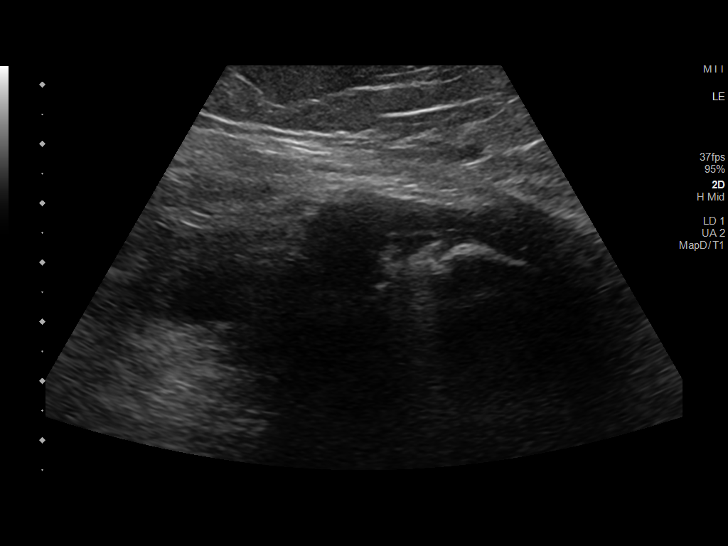

[13 of 24 positions shown; findings below may reference images not displayed]

FINDINGS: Contralateral Common Femoral Vein: Respiratory phasicity is normal
and symmetric with the symptomatic side. No evidence of thrombus.
Normal compressibility.

Common Femoral Vein: Compressible vein with flow maintained. No
evidence of thrombus. Monophasic waveform with lack of respiratory
phasicity.

Saphenofemoral Junction: No evidence of thrombus. Normal
compressibility and flow on color Doppler imaging.

Profunda Femoral Vein: No evidence of thrombus. Normal
compressibility and flow on color Doppler imaging.

Femoral Vein: Patency of the femoral vein maintained, incomplete
compressibility with flow maintained.

Popliteal Vein: Patency of the popliteal vein maintained. Incomplete
compressibility with maintained flow.

Calf Veins: No evidence of thrombus. Normal compressibility and flow
on color Doppler imaging.

Superficial Great Saphenous Vein: No evidence of thrombus. Normal
compressibility and flow on color Doppler imaging.

Other Findings: Complex lentiform collection and the left knee
measuring 6.4 cm x 1.8 cm by 4.2 cm.
IMPRESSION: Sonographic survey negative for acute DVT.

Monophasic waveform of the common femoral vein indicating compromise
of the more proximal iliac venous system.

Chronic changes of remote DVT involving the femoral and popliteal
veins.

These results were discussed by telephone at the time of
interpretation on 09/02/2018 at [DATE] with Dr. ARISSA [REDACTED]

## 2021-04-15 IMAGING — IVUS IR INTRAVASCULAR US NON-CORONARY
1 series · 18 of 82 positions shown · non-contrast
Comparison: None.

INDICATION: 53-year-old female with a history chronic left lower extremity
symptoms secondary to prior DVT and post thrombotic syndrome

EXAM:
ULTRASOUND-GUIDED ACCESS LEFT GREAT SAPHENOUS VEIN
LEFT LOWER EXTREMITY VENOGRAM
VENOGRAM IVC
INTRAVASCULAR ULTRASOUND, IVC AND 2 ADDITIONAL VEIN
BALLOON ANGIOPLASTY LEFT COMMON ILIAC VEIN, LEFT EXTERNAL ILIAC VEIN
STENT OF LEFT COMMON ILIAC AND EXTERNAL ILIAC
TECHNIQUE: Informed written consent was obtained from the patient after a
thorough discussion of the procedural risks, benefits and
alternatives. All questions were addressed. Maximal Sterile Barrier
Technique was utilized including caps, mask, sterile gowns, sterile
gloves, sterile drape, hand hygiene and skin antiseptic. A timeout
was performed prior to the initiation of the procedure.

[Series 1: ivus · 0.12mm/px · 13 acquisitions, 18 frames shown]
[im 1/13]
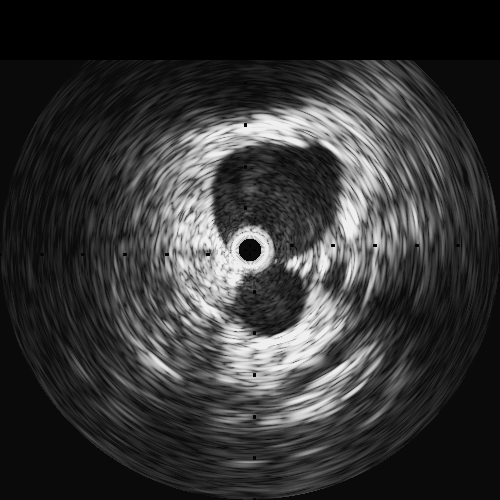
[im 1/13]
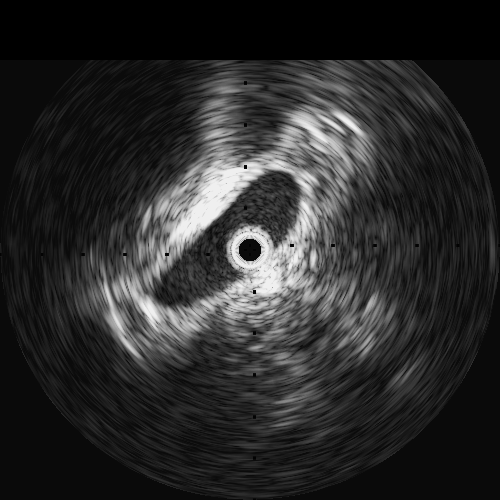
[im 1/13]
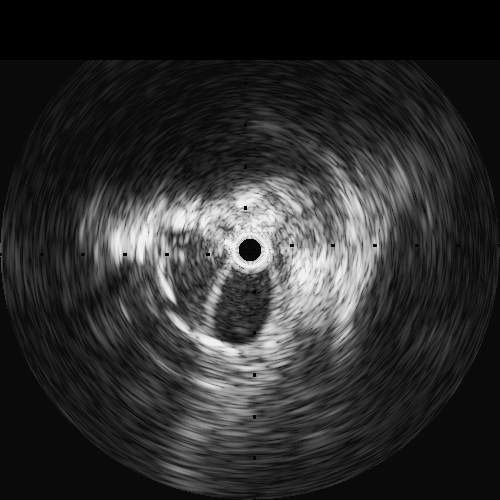
[im 1/13]
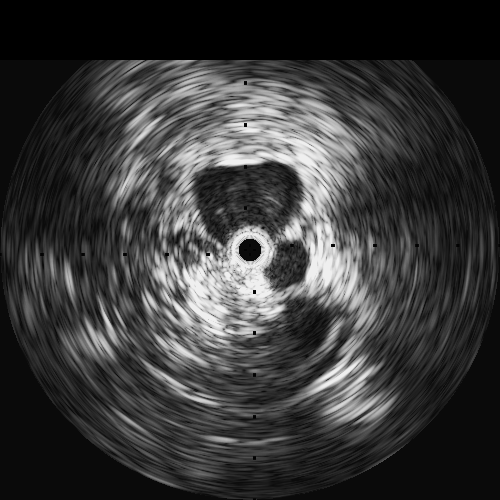
[im 1/13]
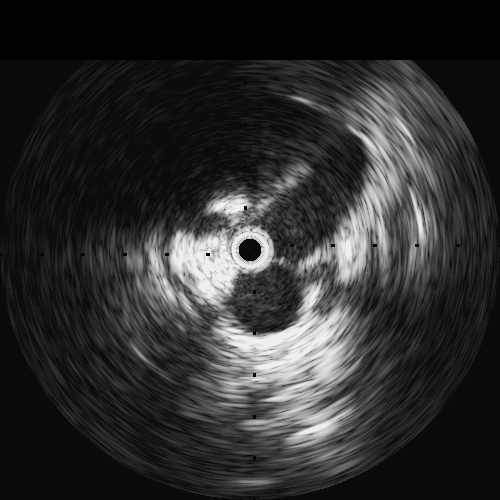
[im 3/13]
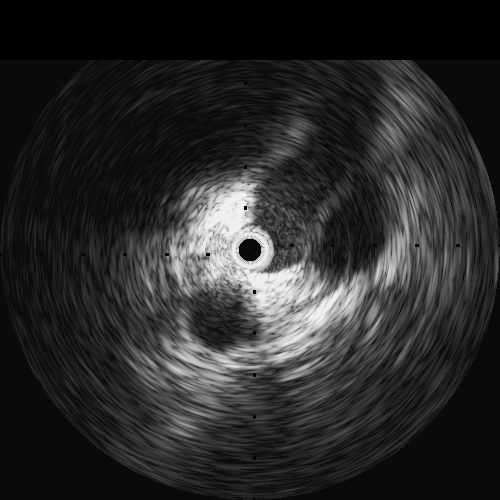
[im 3/13]
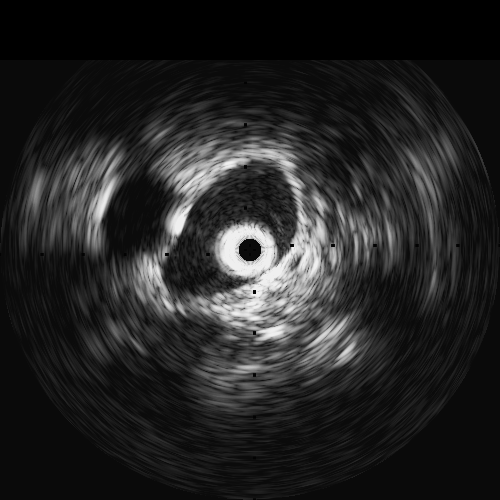
[im 3/13]
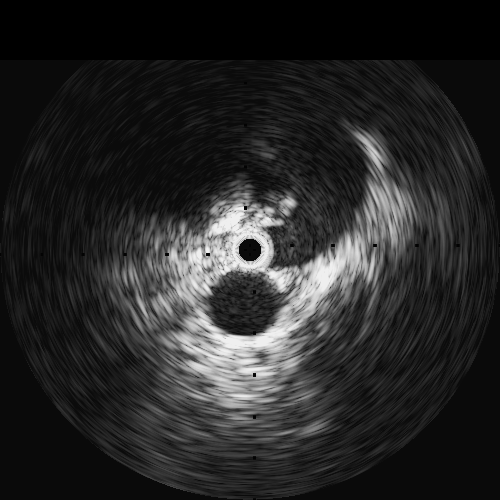
[im 3/13]
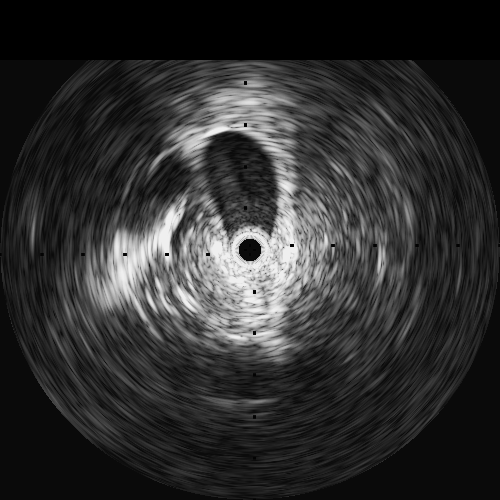
[im 3/13]
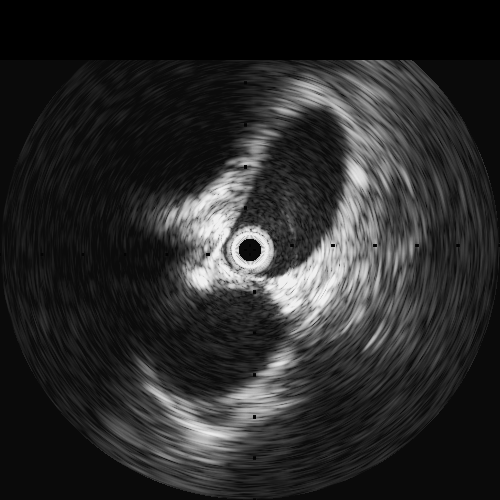
[im 3/13]
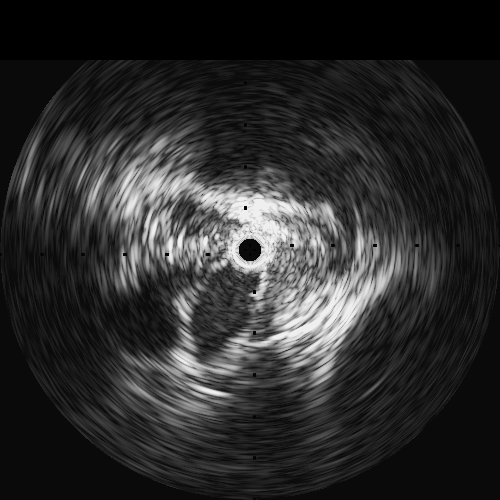
[im 7/13]
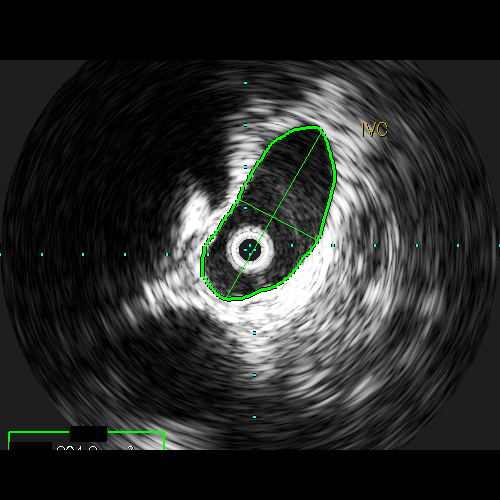
[im 11/13]
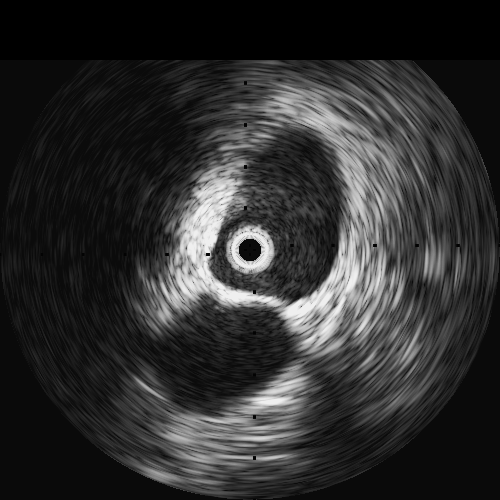
[im 11/13]
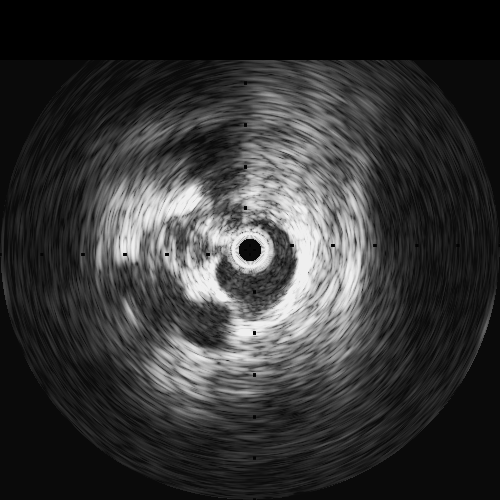
[im 11/13]
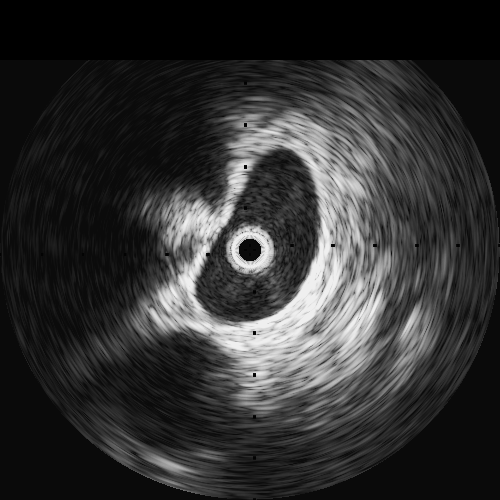
[im 11/13]
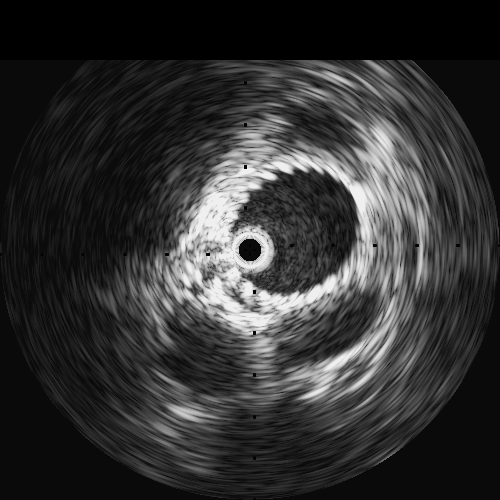
[im 11/13]
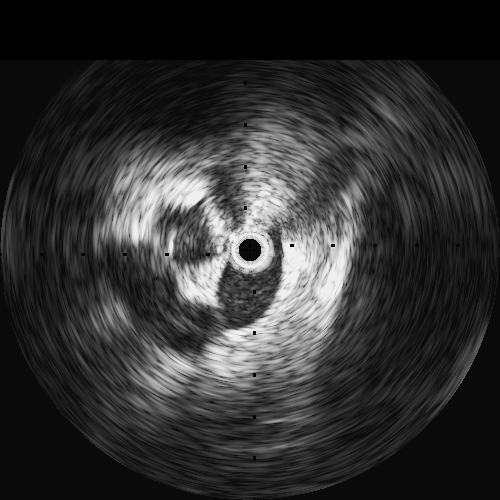
[im 11/13]
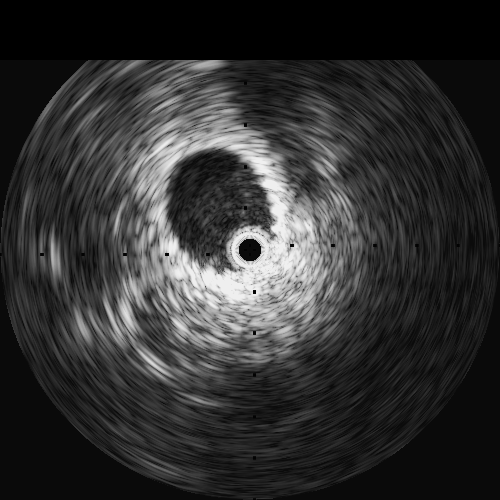

[18 of 82 positions shown; findings below may reference images not displayed]

MEDICATIONS:
8333 units heparin, 30 mg IV Toradol

ANESTHESIA/SEDATION:
Versed 3.5 mg IV; Fentanyl 150 mcg IV

Moderate Sedation Time:  53 minutes

The patient was continuously monitored during the procedure by the
interventional radiology nurse under my direct supervision.

FLUOROSCOPY TIME:  Fluoroscopy Time: 6 minutes 6 seconds (204 mGy).

COMPLICATIONS:
None
Ultrasound survey of the left inguinal region was performed with
images stored and sent to PACs, confirming patency of the vessel.

A micropuncture needle was used access the left great saphenous vein
under ultrasound. With excellent venous blood flow returned, and an
.018 micro wire was passed through the needle, observed 2 enter the
left iliac venous system fluoroscopy. The needle was removed, and a
micropuncture sheath was placed over the wire. The inner dilator and
wire were removed, and an 035 Bentson wire was advanced under
fluoroscopy into the pelvis. The sheath was removed and a standard 5
French vascular sheath was placed. The dilator was removed and the
sheath was flushed.

Venogram was then performed of the left iliac system. Bentson wire
was passed into the lower IVC, and omni Flush catheter was placed.

IVC venogram was performed.

Rosen wire was then placed to the level of the diaphragm. IVUS
catheter was advanced to the level of the renal veins. Interrogation
of the infrarenal IVC was then performed with pullback IVUS
recording, with pullback IVUS recording into the iliac system.

Formal angiogram was repeated of the left iliac system and used as a
reference. Pullback IVUS again performed.

Measurements were made at the presumed site of Aujla lesion as
well as presumed site of Distinguish Mkhabela Anitah lesion at the common iliac
vein/external iliac vein transition. IVUS was used in real-time with
Valsalva maneuver in the IVC, common iliac vein, external iliac vein
confirming static lesions at these sites with decreased capacitance.

A Bentson wire was then placed into the right common iliac vein with
a Cobra catheter. Venogram was performed at common iliac vein
confluence from the sheath and the Cobra catheter.

Balloon angioplasty was then performed in the common iliac vein and
external iliac vein with a 14 mm balloon angioplasty. 16 mm balloon
angioplasty was performed of the common iliac vein. Repeat
intravascular ultrasound was performed confirming static lesions at
these sites.

We selected a 60 mm x 90 mm dedicated venous stent ([employer]) for
treatment. This was deployed and then post dilated with 16 mm
balloon angioplasty.

Repeat venogram performed demonstrating rapid transit with no
collateral filling of pelvic vessels.

Repeat intravascular ultrasound performed with cross-sectional
diameter measured at the prior lesions confirming improved
diameters/cross-sectional area.

All catheters wires were removed.

Patient tolerated the procedure well and remained hemodynamically
stable throughout.

No complications were encountered and no significant blood loss.
FINDINGS: Ultrasound imaging of the left inguinal region demonstrates patent
common femoral vein with complete compressibility endo chronic wall
changes on live ultrasound. Patent great saphenous vein.

Initial venogram of the left lower extremity demonstrates patent
iliac system with 50% narrowing by venogram at the transition of
common iliac vein to external iliac vein and filling of the internal
iliac vein.

Venogram of the IVC demonstrates patency to the right atrium.

Intravascular ultrasound:

Cross-sectional area of the common iliac vein at the site of
Aujla: 85 millimeter square. Normal/internal reference of the
adjacent segment 195 millimeter square

Omalicha lesion at the C IV/EIV: 60 millimeters square.
Normal/internal reference of the adjacent segment 1 are 34
millimeter square

Post stent intravascular ultrasound:

Cross-sectional area at the stented segment at the Aujla
lesion: 191 millimeters square

Cross-sectional area at the stented Omalicha lesion: 177
millimeters square

Repeat venogram of the left lower extremity demonstrates rapid
transit of flow through the stented segment with no pelvic
collaterals filling.
IMPRESSION: Status post ultrasound guided access left great saphenous vein for
left lower extremity and IVC venogram with IVUS evaluation and
treatment of tandem left common iliac vein (Aujla) stenosis
and distal common iliac vein Omalicha lesion with 16 mm x 90 mm
venous stent.
